# Patient Record
Sex: Male | Born: 1945 | Race: Black or African American | Hispanic: No | Marital: Married | Smoking: Never smoker
Health system: Southern US, Community
[De-identification: ages and names within clinical notes are randomized; demographics above are authoritative.]

## PROBLEM LIST (undated history)

## (undated) DIAGNOSIS — M4800 Spinal stenosis, site unspecified: Secondary | ICD-10-CM

## (undated) DIAGNOSIS — G4730 Sleep apnea, unspecified: Secondary | ICD-10-CM

## (undated) DIAGNOSIS — M5417 Radiculopathy, lumbosacral region: Secondary | ICD-10-CM

## (undated) DIAGNOSIS — E119 Type 2 diabetes mellitus without complications: Secondary | ICD-10-CM

## (undated) HISTORY — PX: COLONOSCOPY W/ POLYPECTOMY: SHX1380

## (undated) HISTORY — DX: Radiculopathy, lumbosacral region: M54.17

## (undated) HISTORY — DX: Spinal stenosis, site unspecified: M48.00

## (undated) HISTORY — DX: Type 2 diabetes mellitus without complications: E11.9

## (undated) HISTORY — PX: KNEE ARTHROSCOPY: SUR90

## (undated) HISTORY — DX: Sleep apnea, unspecified: G47.30

---

## 1999-02-12 ENCOUNTER — Ambulatory Visit (HOSPITAL_COMMUNITY): Admission: RE | Admit: 1999-02-12 | Discharge: 1999-02-12 | Payer: Self-pay | Admitting: Gastroenterology

## 1999-06-04 ENCOUNTER — Encounter: Admission: RE | Admit: 1999-06-04 | Discharge: 1999-07-19 | Payer: Self-pay | Admitting: Family Medicine

## 2001-01-16 ENCOUNTER — Emergency Department (HOSPITAL_COMMUNITY): Admission: EM | Admit: 2001-01-16 | Discharge: 2001-01-17 | Payer: Self-pay | Admitting: Emergency Medicine

## 2001-01-16 ENCOUNTER — Encounter: Payer: Self-pay | Admitting: Emergency Medicine

## 2002-05-19 ENCOUNTER — Encounter (INDEPENDENT_AMBULATORY_CARE_PROVIDER_SITE_OTHER): Payer: Self-pay

## 2002-05-19 ENCOUNTER — Ambulatory Visit (HOSPITAL_COMMUNITY): Admission: RE | Admit: 2002-05-19 | Discharge: 2002-05-19 | Payer: Self-pay | Admitting: Gastroenterology

## 2005-04-30 ENCOUNTER — Encounter: Admission: RE | Admit: 2005-04-30 | Discharge: 2005-04-30 | Payer: Self-pay | Admitting: Family Medicine

## 2006-05-21 IMAGING — CR DG LUMBAR SPINE COMPLETE 4+V
5 series · 5 of 5 positions shown · non-contrast
Comparison: None.

CLINICAL DATA: Right sciatica.

[view not recorded (1 of 5)]
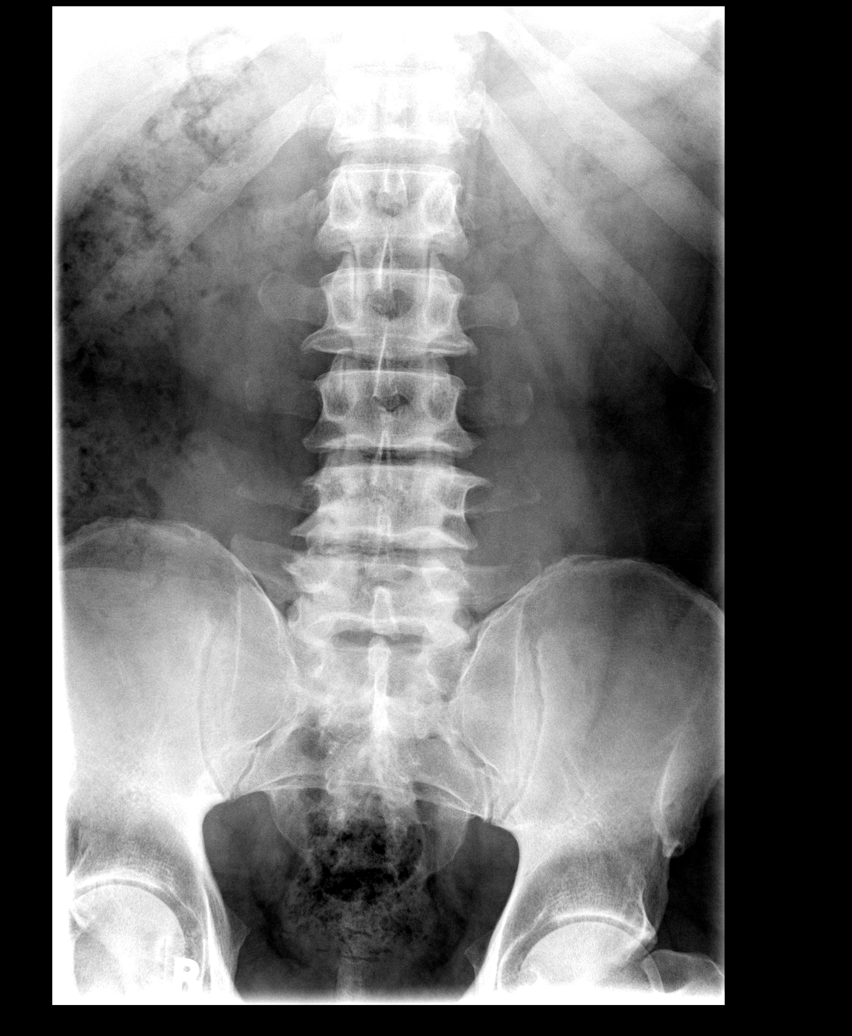

[view not recorded (2 of 5)]
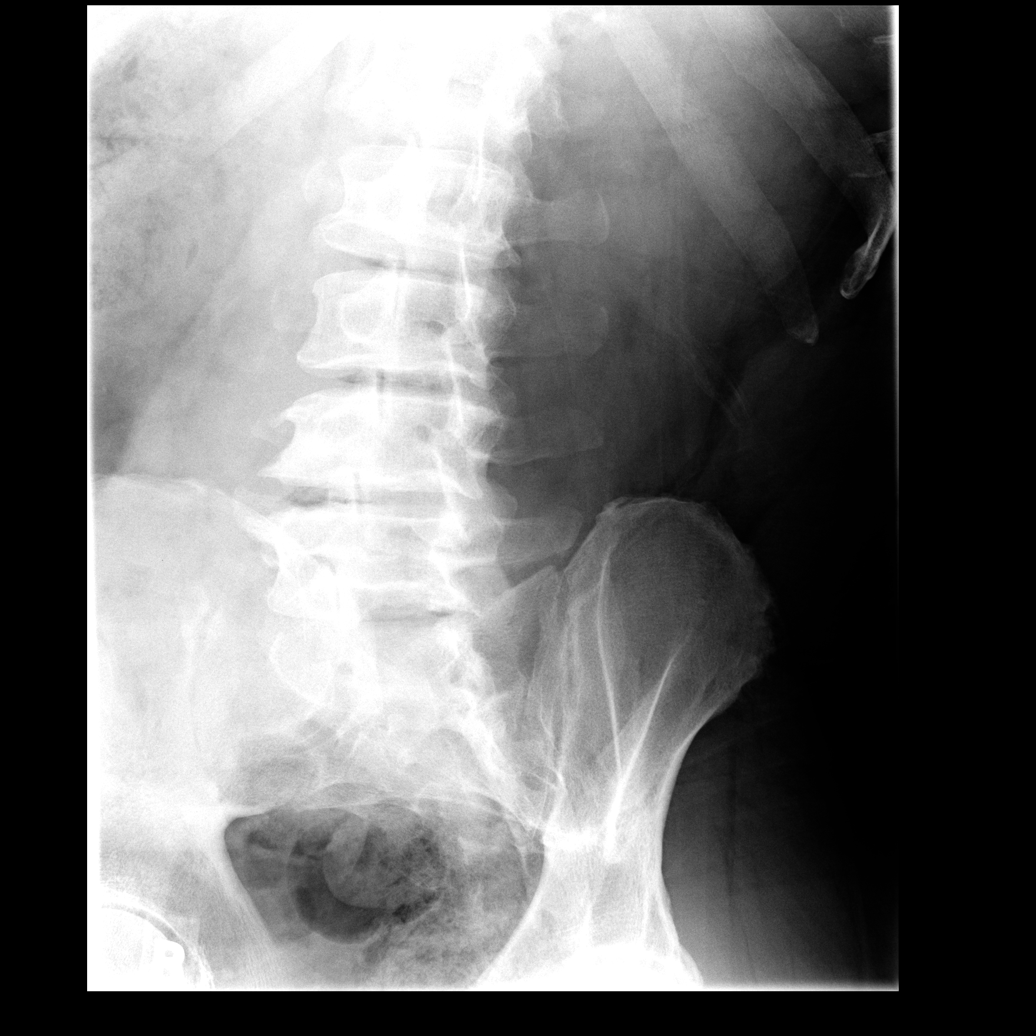

[view not recorded (3 of 5)]
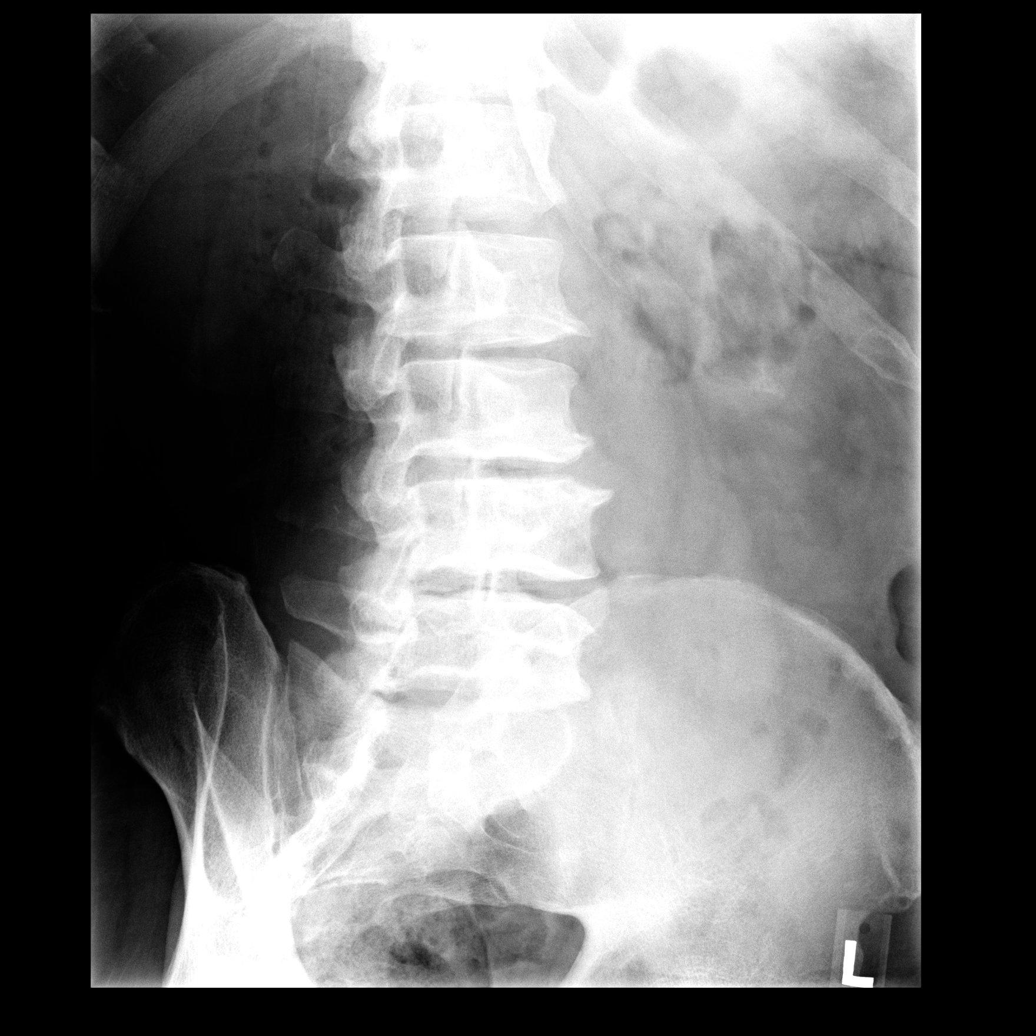

[view not recorded (4 of 5)]
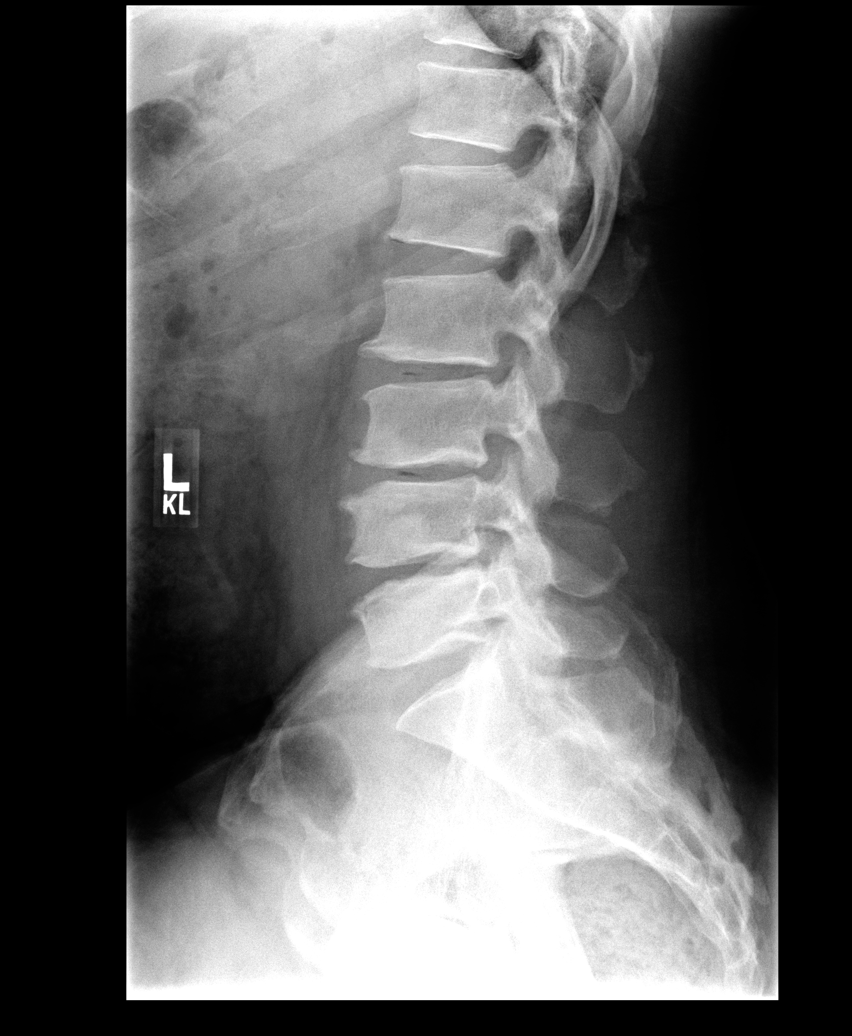

[view not recorded (5 of 5)]
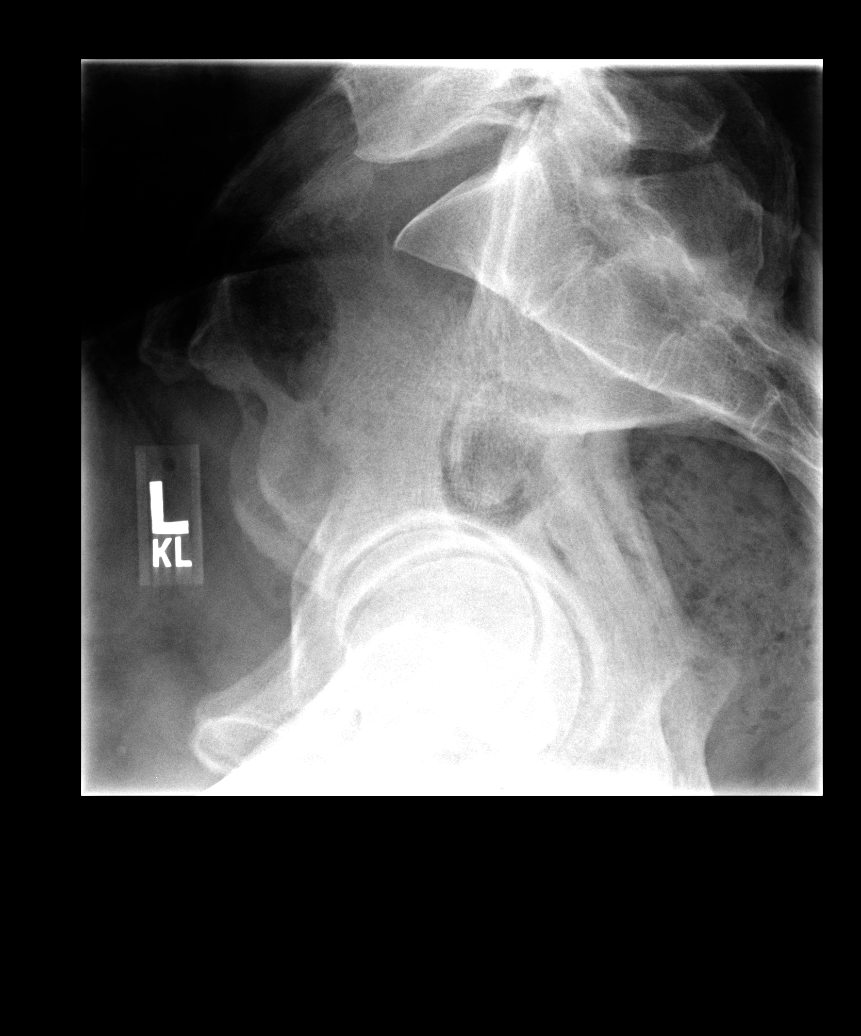

[5 of 5 positions shown; findings below may reference images not displayed]

LUMBAR SPINE - 4 VIEW:
 Five nonribbearing lumbar type vertebral bodies.  Loss of disk height noted at L2-3 through L4-5 with vacuum disk phenomenon at L2-3 and L3-4.   These same levels have associated end plate hypertrophy.  No subluxation or evidence for fracture.
IMPRESSION: Degenerative disk disease with mild facet arthropathy in the lower lumbar spine. 
 [REDACTED]

## 2006-09-26 ENCOUNTER — Encounter: Admission: RE | Admit: 2006-09-26 | Discharge: 2006-09-26 | Payer: Self-pay | Admitting: Dermatology

## 2007-07-01 ENCOUNTER — Encounter (INDEPENDENT_AMBULATORY_CARE_PROVIDER_SITE_OTHER): Payer: Self-pay | Admitting: Gastroenterology

## 2007-10-17 IMAGING — US US CAROTID DUPLEX BILAT
1 series · 14 of 24 positions shown · non-contrast
Comparison: none

CLINICAL DATA: Left eye blackouts.  
 CAROTID DOPPLER ULTRASOUND:
 Velocities are as follows (cm per second):
 SITE  PEAK SYSTOLIC  END-DIASTOLIC
 RIGHT ICA    46  21
 RIGHT CCA    68  11
 RIGHT ICA/CCA RATIO  .68
 RIGHT ECA    34

[Series 1: unknown · 0.07mm/px · 14 of 66 slices shown]
[im 1/66]
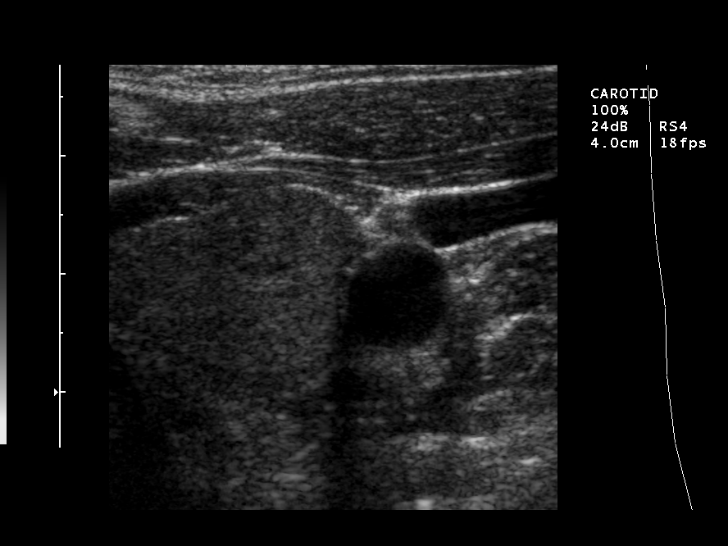
[im 6/66]
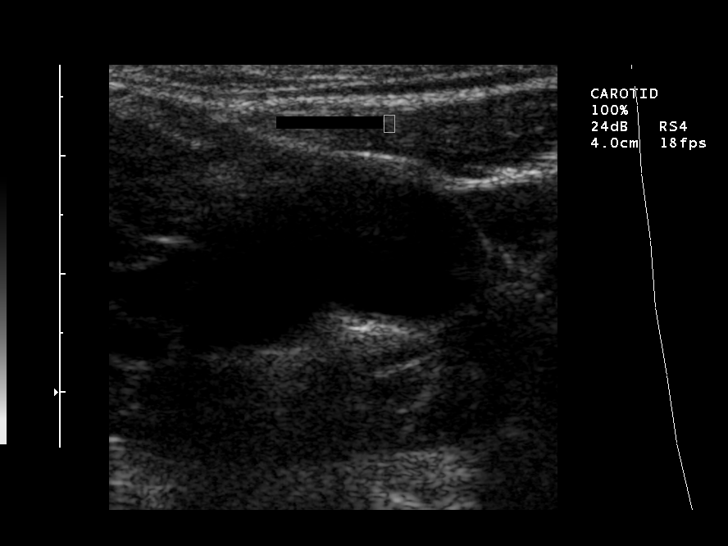
[im 12/66]
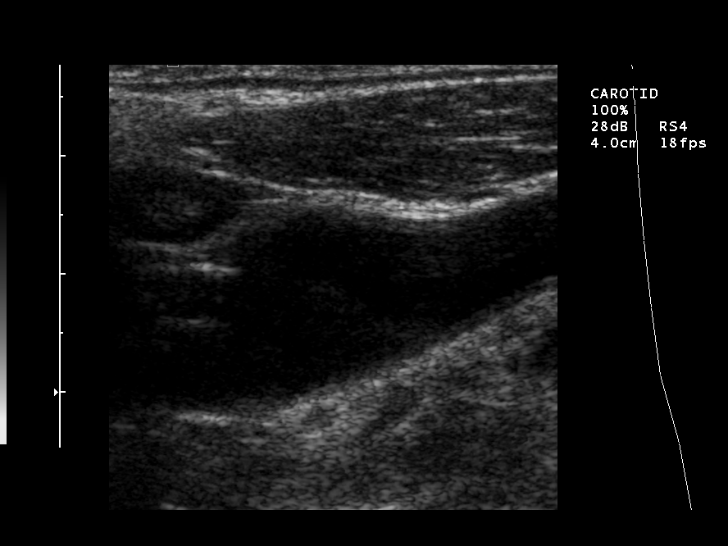
[im 17/66]
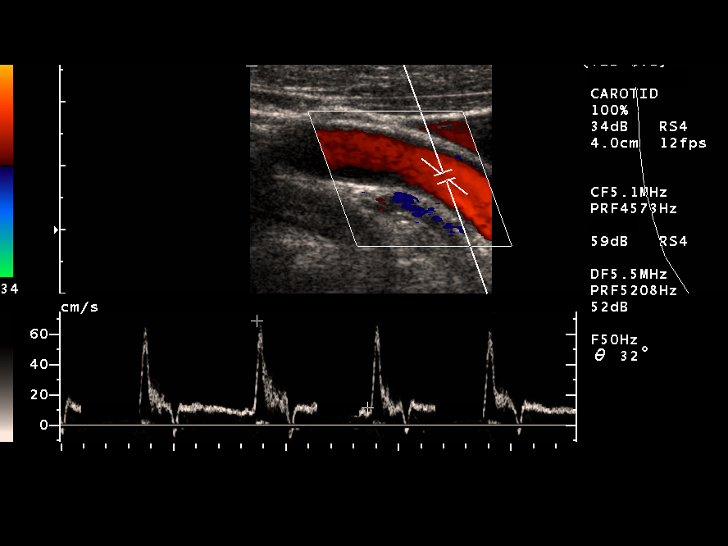
[im 20/66]
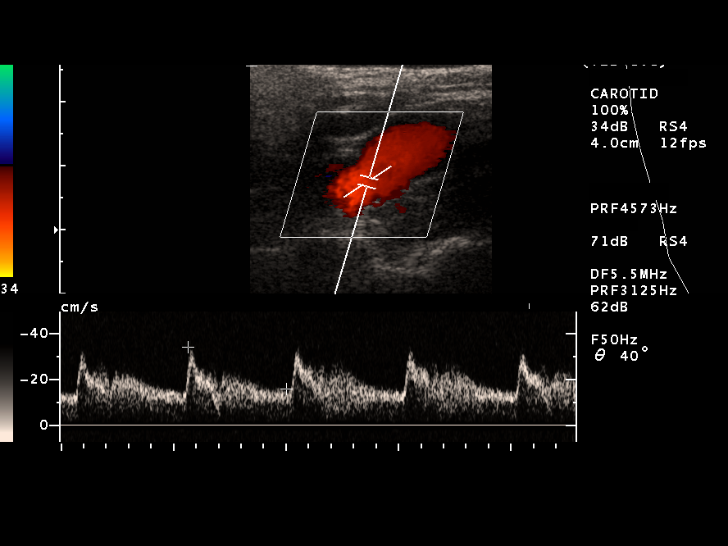
[im 26/66]
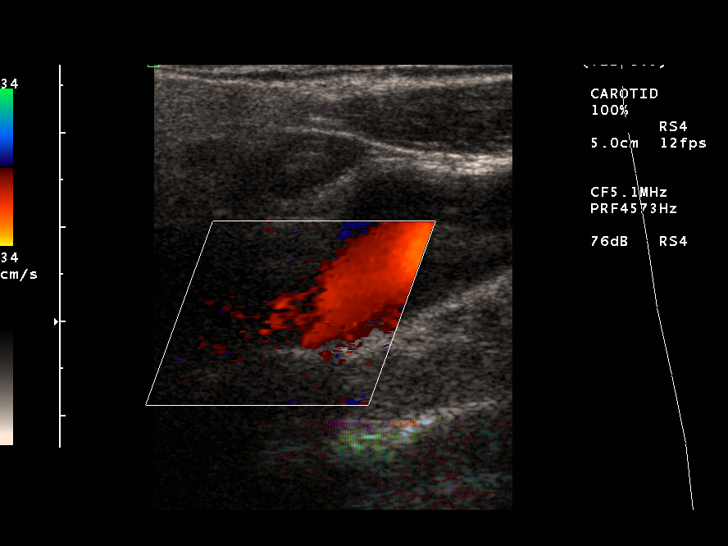
[im 32/66]
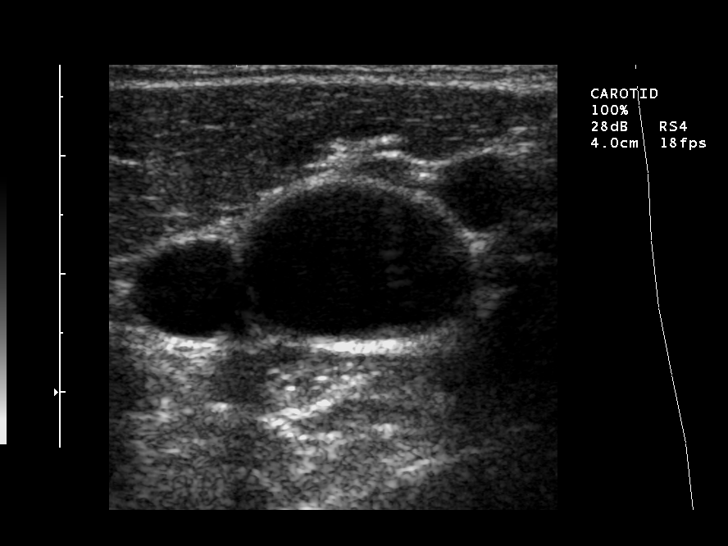
[im 34/66]
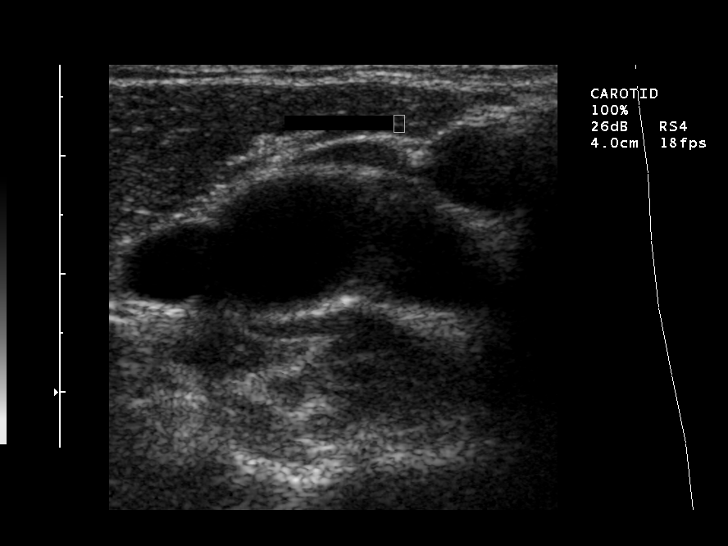
[im 40/66]
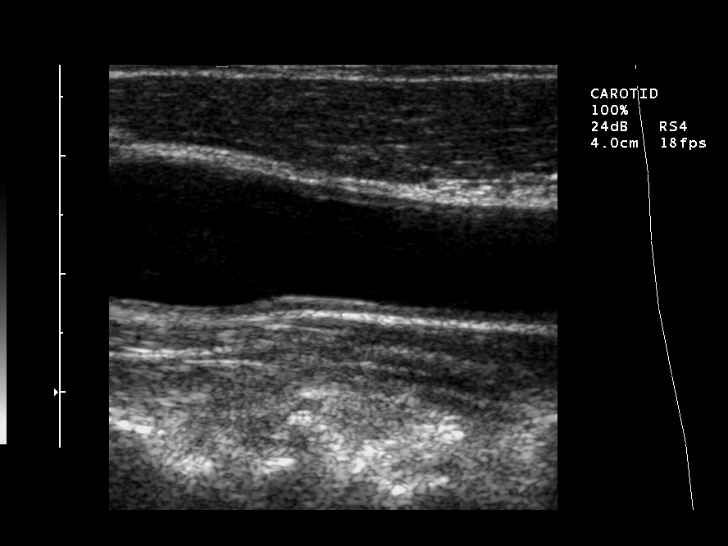
[im 46/66]
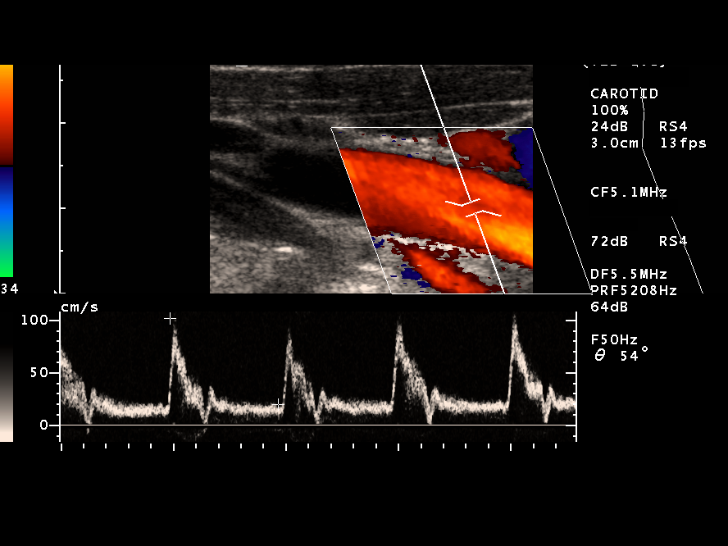
[im 51/66]
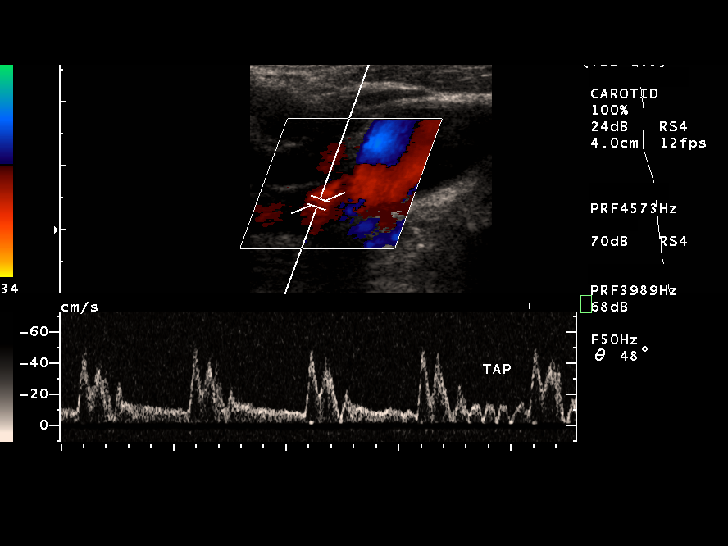
[im 54/66]
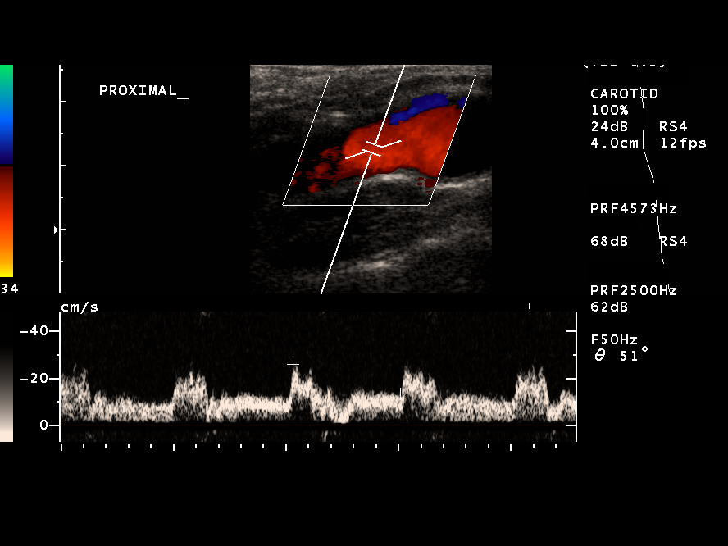
[im 60/66]
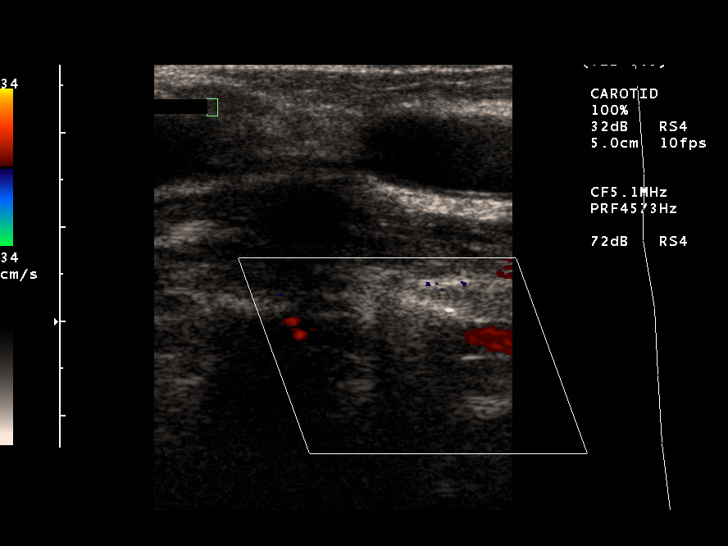
[im 66/66]
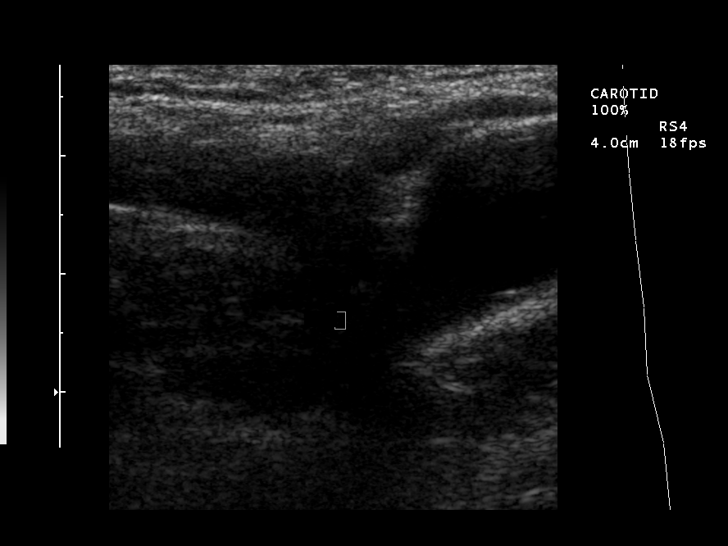

[14 of 24 positions shown; findings below may reference images not displayed]

LEFT ICA    57  27
 LEFT CCA    101  19
 LEFT ICA/CCA RATIO  .56
 LEFT ECA    50
 Minimal plaque is seen in the right ICA bulb.  The right vertebral is antegrade in flow.  Doppler analysis demonstrates a low resistance wave form in the right ICA.  There is minimal plaque in the left ICA bulb.  Doppler analysis demonstrates a low resistance wave form.  The left vertebral is antegrade in flow.
IMPRESSION: Minimal plaque in the ICA bulbs bilaterally.  Estimated stenosis is close to 0.

## 2010-01-21 ENCOUNTER — Encounter: Admission: RE | Admit: 2010-01-21 | Discharge: 2010-01-21 | Payer: Self-pay | Admitting: Internal Medicine

## 2011-02-04 ENCOUNTER — Other Ambulatory Visit: Payer: Self-pay | Admitting: Neurological Surgery

## 2011-02-04 DIAGNOSIS — M545 Low back pain: Secondary | ICD-10-CM

## 2011-02-09 ENCOUNTER — Ambulatory Visit
Admission: RE | Admit: 2011-02-09 | Discharge: 2011-02-09 | Disposition: A | Discharge status: Home / Self Care | Payer: 59 | Source: Ambulatory Visit | Attending: Neurological Surgery | Admitting: Neurological Surgery

## 2011-02-09 DIAGNOSIS — M545 Low back pain: Secondary | ICD-10-CM

## 2011-05-10 NOTE — Consult Note (Signed)
[place]  Patient:    Jermaine [name], Jermaine [name]                        MRN: [id number] Proc. Date: 01/17/01 Adm. Date:  [id number] Disc. Date: [id number] Attending:  Lorre Nick CC:         Carolyne Fiscal, M.D.   Consultation Report  HISTORY OF PRESENT ILLNESS:  Mr. Jermaine [name] is a 65 year old black gentleman without a significant past medical history, who presents with substernal chest discomfort.  The patient was in his usual state of health when at approximately 8 p.m. while driving noted the onset of midsternal chest discomfort.  This was described as a sharp pain.  It was not associated with shortness of breath, radiation, nausea, vomiting, or diaphoresis.  He had no palpitations, no lightheadedness, syncope, or presyncope.  The pain gradually started dissipating on its own, and on presentation to the emergency room was subsequently relieved with nitroglycerin.  It is unclear whether the nitroglycerin facilitated the relief of pain.  The patient denies any recent history of exertional chest discomfort, syncope, presyncope, orthopnea, paroxysmal nocturnal dyspnea, lower extremity edema, or palpitations.  PAST MEDICAL HISTORY:  Notable for sinus surgery in December 2001, and a questionable history of hypercholesterolemia which has been diet-controlled.  ALLERGIES:  No known drug allergies.  CURRENT MEDICATIONS:  Antibiotics following his sinus surgery.  SOCIAL HISTORY:  The patient has a remote history of tobacco use.  Quit greater than 30 years ago.  Occasional wine.  Denies heavy alcohol use.  The patient works as a Curator for [employer], also does janitorial work on the side.  He is an Immunologist.  FAMILY HISTORY:  Father died at age 61 with diabetes mellitus.  Mother alive at the age of 71.  No medical problems.  PHYSICAL EXAMINATION:  GENERAL:  He is a well-developed, well-nourished black male, in no acute distress.  VITAL  SIGNS:  Blood pressure 107/56, heart rate 60, temperature 97.0 degrees, respirations 18.  O2 saturation 97% on room air.  HEENT:  Pupils equal, round, reactive to light.  Extraocular muscles intact. Oropharynx:  No lesions.  NECK:  Carotid upstrokes are brisk without bruits.  HEART:  A regular rate without murmurs.  LUNGS:  Clear to auscultation.  ABDOMEN:  Soft, nontender.  Good bowel sounds, no guarding or rebound.  CHEST:  Chest wall is nontender to palpation.  EXTREMITIES:  Peripheral pulses are 3+ and equal.  There is no peripheral edema.  NEUROLOGIC:  Motor strength is 5/5.  Sensory intact to touch.  Electrocardiogram:  Normal sinus at 71.  No acute ST-T wave changes or ischemia.  Chest x-ray shows no acute infiltrates or effusions.  LABORATORY DATA:  Sodium 141, potassium 3.1, chloride 104, bicarbonate 25, BUN 8, creatinine 1.0, glucose 120.  White count 5.3, hemoglobin 14.9, hematocrit 43.1, platelets 137.  CPK 275, MB fraction 3.3, troponin I less than 0.01.  ASSESSMENT/PLAN:  [name] is a 65 year old gentleman who presents with atypical chest pain.  The patient has had a complete relief of the discomfort, and it is unclear whether nitroglycerin facilitated this.  He has had no recent symptoms at rest or with exertion, and at this point has no acute ischemic changes on the electrocardiogram to suggest an acute myocardial infarction.  I discussed treatment options with the patient, including admission to the hospital, subsequent rule out of a myocardial infarction with enzymes and electrocardiograms, and consideration of  a stress test, versus a cardiac catheterization, based on the findings, or an outpatient stress test.  As the patient is feeling better, he wishes to arrange for an outpatient stress test. I have provided him with a prescription for nitroglycerin and have instructed him to return to the emergency room for any recurrence of pain.  I will see the  patient in followup in the office. DD:  01/17/01 TD:  01/17/01 Job: [id number]

## 2011-05-10 NOTE — Procedures (Signed)
[facility]  Patient:    Jermaine, [name] Visit Number: [id number] MRN: [id number]          Service Type: END Location: ENDO Attending Physician:  Louie Bun Dictated by:   Everardo All Madilyn Fireman, M.D. Proc. Date: 05/19/02 Admit Date:  05/19/2002   CC:         Carolyne Fiscal, M.D.   Procedure Report  PROCEDURE:  Colonoscopy.  INDICATION FOR PROCEDURE:  History of adenomatous colon polyp three years ago on index colonoscopy.  DESCRIPTION OF PROCEDURE:  The patient was placed in the left lateral decubitus position then placed on the pulse monitor with continuous low flow oxygen delivered by nasal cannula. He was sedated with 60 mg IV Demerol and 6 mg IV Versed. The Olympus video colonoscope was inserted into the rectum and advanced to the cecum, confirmed by transillumination at McBurneys point and visualization of the ileocecal valve and appendiceal orifice. The prep was excellent. In the distal cecum, there was a 6 mm sessile polyp that was fulgurated by hot biopsy. The remainder of the cecum, ascending, transverse, and descending colon appeared normal with no further masses, polyps, diverticula or other mucosal abnormalities. Within the sigmoid colon, there was seen an 8 mm polyp which was removed by snare. The remainder of the sigmoid and rectum appeared normal and retroflexed view of the anus revealed no obvious internal hemorrhoids. The colonoscope was then withdrawn and the patient returned to the recovery room in stable condition. The patient tolerated the procedure well and there were no immediate complications.  IMPRESSION:  Cecal and sigmoid colon polyps.  PLAN:  Await histology and will repeat colonoscopy in 3-5 years. Dictated by:   Everardo All Madilyn Fireman, M.D. Attending Physician:  Louie Bun DD:  05/19/02 TD:  05/20/02 Job: [phone]

## 2011-11-12 ENCOUNTER — Other Ambulatory Visit: Payer: Self-pay | Admitting: Internal Medicine

## 2011-11-12 ENCOUNTER — Ambulatory Visit
Admission: RE | Admit: 2011-11-12 | Discharge: 2011-11-12 | Disposition: A | Discharge status: Home / Self Care | Payer: 59 | Source: Ambulatory Visit | Attending: Internal Medicine | Admitting: Internal Medicine

## 2011-11-12 ENCOUNTER — Other Ambulatory Visit (HOSPITAL_COMMUNITY): Payer: Self-pay | Admitting: *Deleted

## 2011-11-12 ENCOUNTER — Other Ambulatory Visit: Payer: Self-pay | Admitting: *Deleted

## 2011-11-12 DIAGNOSIS — K449 Diaphragmatic hernia without obstruction or gangrene: Secondary | ICD-10-CM

## 2011-11-12 MED ORDER — IOHEXOL 300 MG/ML  SOLN
125.0000 mL | Freq: Once | INTRAMUSCULAR | Status: AC | PRN
  Administered 2011-11-12: 125 mL via INTRAVENOUS

## 2011-12-01 ENCOUNTER — Ambulatory Visit (INDEPENDENT_AMBULATORY_CARE_PROVIDER_SITE_OTHER): Payer: 59

## 2011-12-01 DIAGNOSIS — H66009 Acute suppurative otitis media without spontaneous rupture of ear drum, unspecified ear: Secondary | ICD-10-CM

## 2012-08-06 ENCOUNTER — Other Ambulatory Visit: Payer: Self-pay | Admitting: Gastroenterology

## 2014-06-09 ENCOUNTER — Encounter: Payer: Self-pay | Admitting: *Deleted

## 2014-06-10 ENCOUNTER — Ambulatory Visit (INDEPENDENT_AMBULATORY_CARE_PROVIDER_SITE_OTHER): Payer: 59 | Admitting: Cardiology

## 2014-06-10 ENCOUNTER — Encounter: Payer: Self-pay | Admitting: Cardiology

## 2014-06-10 VITALS — BP 118/62 | HR 81 | Ht 72.0 in | Wt 228.0 lb

## 2014-06-10 DIAGNOSIS — R0609 Other forms of dyspnea: Secondary | ICD-10-CM

## 2014-06-10 DIAGNOSIS — R0600 Dyspnea, unspecified: Secondary | ICD-10-CM | POA: Insufficient documentation

## 2014-06-10 DIAGNOSIS — R0989 Other specified symptoms and signs involving the circulatory and respiratory systems: Secondary | ICD-10-CM

## 2014-06-10 NOTE — Progress Notes (Signed)
   HPI The patient presents for evaluation of dyspnea with exertion. He has no past cardiac history although he does think he had a treadmill test some years ago. He has noticed that when he does such things as going to garbage cans to the curb he has dyspnea.  This has been going on for about 6 months. He does not think that this is worse than previous. He stops what he is doing and recovers quickly. He denies any chest pressure, neck or arm discomfort. He denies any palpitations, presyncope or syncope. He has no PND or orthopnea. He has no weight gain or edema. He otherwise remains active.  No Known Allergies  Medications:  None    Past Medical History  Diagnosis Date  . Sleep apnea     CPAP  . Spinal stenosis   . 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 Knee arthroscopy      Family History  Problem Relation Age of Onset  . Diabetes Father     History   Social History  . Marital Status: Married    Spouse Name: N/A    Number of Children: 2  . Years of Education: N/A   Occupational History  . Not on file.   Social History Main Topics  . Smoking status: Never Smoker   . Smokeless tobacco: Not on file  . Alcohol Use: Not on file  . Drug Use: Not on file  . Sexual Activity: Not on file   Other Topics Concern  . Not on file   Social History Narrative   Lives with wife.      ROS:  As stated in the HPI and negative for all other systems.  PHYSICAL EXAM BP 118/62  Pulse 81  Ht 6' (1.829 m)  Wt 228 lb (103.42 kg)  BMI 30.92 kg/m2 GENERAL:  Well appearing, looks younger than stated age.  HEENT:  Pupils equal round and reactive, fundi not visualized, oral mucosa unremarkable NECK:  No jugular venous distention, waveform within normal limits, carotid upstroke brisk and symmetric, no bruits, no thyromegaly LYMPHATICS:  No cervical, inguinal adenopathy LUNGS:  Clear to auscultation bilaterally BACK:   No CVA tenderness CHEST:  Unremarkable HEART:  PMI not displaced or sustained,S1 and S2 within normal limits, no S3, no S4, no clicks, no rubs, no murmurs ABD:  Flat, positive bowel sounds normal in frequency in pitch, no bruits, no rebound, no guarding, no midline pulsatile mass, no hepatomegaly, no splenomegaly EXT:  2 plus pulses throughout, no edema, no cyanosis no clubbing SKIN:  No rashes no nodules NEURO:  Cranial nerves II through XII grossly intact, motor grossly intact throughout, left foot drop PSYCH:  Cognitively intact, oriented to person place and time   EKG:  Sinus rhythm, rate 81, left axis deviation, left bundle branch block, no acute ST-T wave changes, poor anterior R wave progression.  06/10/2014  ASSESSMENT AND PLAN  DYPSNEA:  The pretest probability of obstructive CAD is somewhat low.  I will bring the patient back for a POET (Plain Old Exercise Test). This will allow me to screen for obstructive coronary disease, risk stratify and very importantly provide a prescription for exercise.

## 2014-06-10 NOTE — Patient Instructions (Addendum)
The current medical regimen is effective;  continue present plan and medications.  Your physician has requested that you have an exercise tolerance test. For further information please visit [email or web address]. Please also follow instruction sheet, as given.  Follow up in 1 year with Dr Hochrein.  You will receive a letter in the mail 2 months before you are due.  Please call us when you receive this letter to schedule your follow up appointment.  

## 2014-06-23 ENCOUNTER — Telehealth (HOSPITAL_COMMUNITY): Payer: Self-pay

## 2014-06-23 NOTE — Telephone Encounter (Signed)
Awaiting return call

## 2014-06-28 ENCOUNTER — Ambulatory Visit (HOSPITAL_COMMUNITY)
Admission: RE | Admit: 2014-06-28 | Discharge: 2014-06-28 | Disposition: A | Discharge status: Home / Self Care | Payer: 59 | Source: Ambulatory Visit | Attending: Internal Medicine | Admitting: Internal Medicine

## 2014-06-28 DIAGNOSIS — R0609 Other forms of dyspnea: Secondary | ICD-10-CM | POA: Insufficient documentation

## 2014-06-28 DIAGNOSIS — R0989 Other specified symptoms and signs involving the circulatory and respiratory systems: Principal | ICD-10-CM | POA: Insufficient documentation

## 2014-06-28 DIAGNOSIS — R0600 Dyspnea, unspecified: Secondary | ICD-10-CM

## 2014-06-28 NOTE — Procedures (Signed)
Exercise Treadmill Test   Test  Exercise Tolerance Test Ordering MD: Marijo File, MD    Unique Test No: 1   Treadmill:  1  Indication for ETT: exertional dyspnea  Contraindication to ETT: No   Stress Modality: exercise - treadmill  Cardiac Imaging Performed: non   Protocol: standard Bruce - maximal  Max BP:  186/79  Max MPHR (bpm):  153 85% MPR (bpm):  130  MPHR obtained (bpm):  157 % MPHR obtained:  103  Reached 85% MPHR (min:sec):  4 Total Exercise Time (min-sec):  7  Workload in METS:  8.5 Borg Scale: 15  Reason ETT Terminated:  dyspnea    ST Segment Analysis At Rest: normal ST segments - no evidence of significant ST depression With Exercise: no evidence of significant ST depression  Other Information Arrhythmia:  No Angina during ETT:  absent (0) Quality of ETT:  diagnostic  ETT Interpretation:  normal - no evidence of ischemia by ST analysis  Comments: Fair exercise tolerance Normal BP response No ischemic EKG changes with exercise Duke Treadmill Score of +7 Negative bruce treadmill stress test for ischemia.  Pixie Casino, MD, [facility] Attending Cardiologist [place]

## 2014-06-30 NOTE — Telephone Encounter (Signed)
Encounter complete. 

## 2015-05-24 ENCOUNTER — Ambulatory Visit (INDEPENDENT_AMBULATORY_CARE_PROVIDER_SITE_OTHER): Payer: PPO | Admitting: Family Medicine

## 2015-05-24 ENCOUNTER — Ambulatory Visit (INDEPENDENT_AMBULATORY_CARE_PROVIDER_SITE_OTHER): Payer: PPO

## 2015-05-24 VITALS — BP 115/70 | HR 77 | Temp 98.4°F | Resp 17 | Ht 70.5 in | Wt 221.6 lb

## 2015-05-24 DIAGNOSIS — W109XXA Fall (on) (from) unspecified stairs and steps, initial encounter: Secondary | ICD-10-CM

## 2015-05-24 DIAGNOSIS — S8992XA Unspecified injury of left lower leg, initial encounter: Secondary | ICD-10-CM

## 2015-05-24 DIAGNOSIS — S76112A Strain of left quadriceps muscle, fascia and tendon, initial encounter: Secondary | ICD-10-CM | POA: Diagnosis not present

## 2015-05-24 NOTE — Progress Notes (Signed)
   Subjective:    Patient ID: Jermaine [name], male    DOB: 1946/05/21, 69 y.o.   MRN: [id number]  HPI Patient presents with dislocated knee that occurred following falling onleft knee this morning. Tripped on steps and knee hit cement sidewalk. Denies pain and endorses swelling that has improved. Has had ice on knee since injury occurrence. Denies numbness or tingling of foot and no loss of function, but had decreased ROM. Walked into office, but is unable to completely straighten leg. No previous injuries or surgeries to knee.  Denies PMH and not taking any medication. NKDA.   Review of Systems  Constitutional: Negative.   Musculoskeletal: Positive for joint swelling and gait problem. Negative for myalgias and arthralgias.  Skin: Negative for color change and wound.  Neurological: Negative for weakness and numbness.       Objective:   Physical Exam  Constitutional: He is oriented to person, place, and time. He appears well-developed and well-nourished. No distress.  Blood pressure 115/70, pulse 77, temperature 98.4 F (36.9 C), temperature source Oral, resp. rate 17, height 5' 10.5" (1.791 m), weight 221 lb 9.6 oz (100.517 kg), SpO2 98 %.  HENT:  Head: Normocephalic and atraumatic.  Right Ear: External ear normal.  Left Ear: External ear normal.  Eyes: Conjunctivae are normal. Right eye exhibits no discharge. Left eye exhibits no discharge. No scleral icterus.  Pulmonary/Chest: Effort normal.  Musculoskeletal: He exhibits edema.       Left knee: He exhibits decreased range of motion and abnormal alignment. He exhibits no swelling, no effusion, no ecchymosis, no deformity (patella dislocated), no laceration, no erythema, normal patellar mobility and no bony tenderness. Tenderness found. Patellar tendon tenderness noted. No medial joint line, no lateral joint line, no MCL and no LCL tenderness noted.       Left upper leg: He exhibits tenderness and deformity (Quad rupture). He exhibits no  bony tenderness, no swelling, no edema and no laceration.  Neurological: He is alert and oriented to person, place, and time. He displays no atrophy. No cranial nerve deficit or sensory deficit. He exhibits normal muscle tone.  Skin: Skin is warm, dry and intact. No rash noted. He is not diaphoretic.  Psychiatric: He has a normal mood and affect. His behavior is normal. Judgment and thought content normal.   [facility] reading (PRIMARY) by  Dr. Everlene Farrier. No acute fracture. Quad rupture appreciated.     Assessment & Plan:  1. Quadriceps tendon rupture, left, initial encounter 2. Knee injury, left, initial encounter Non-weightbearing on left leg. Knee immobilizer and crutches given. Ice, elevation, and rest. Tylenol or ibuprofen for pain. Scheduled with Dr. Lillia Corporal at [facility] 05/26/15 @ 3:30. - DG Knee Complete 4 Views Left; Future - AMB referral to orthopedics   Alveta Heimlich PA-C  [facility] 05/24/2015 3:01 PM

## 2015-05-24 NOTE — Patient Instructions (Addendum)
No weight on left leg. Ice, elevation, and rest.   Scheduled with Dr. Lillia Corporal at [facility] 05/26/15 @ 3:30. Must arrive by 3:00.

## 2015-05-29 ENCOUNTER — Encounter (HOSPITAL_COMMUNITY): Payer: Self-pay | Admitting: *Deleted

## 2015-05-30 ENCOUNTER — Encounter (HOSPITAL_COMMUNITY): Payer: Self-pay | Admitting: Anesthesiology

## 2015-05-30 ENCOUNTER — Observation Stay (HOSPITAL_COMMUNITY)
Admission: RE | Admit: 2015-05-30 | Discharge: 2015-05-31 | Disposition: A | Discharge status: Home / Self Care | Payer: PPO | Source: Ambulatory Visit | Attending: Orthopedic Surgery | Admitting: Orthopedic Surgery

## 2015-05-30 ENCOUNTER — Encounter (HOSPITAL_COMMUNITY)
Admission: RE | Disposition: A | Discharge status: Home / Self Care | Payer: Self-pay | Source: Ambulatory Visit | Attending: Orthopedic Surgery

## 2015-05-30 ENCOUNTER — Ambulatory Visit (HOSPITAL_COMMUNITY): Payer: PPO | Admitting: Anesthesiology

## 2015-05-30 DIAGNOSIS — M21372 Foot drop, left foot: Secondary | ICD-10-CM | POA: Insufficient documentation

## 2015-05-30 DIAGNOSIS — M5417 Radiculopathy, lumbosacral region: Secondary | ICD-10-CM | POA: Insufficient documentation

## 2015-05-30 DIAGNOSIS — Z6831 Body mass index (BMI) 31.0-31.9, adult: Secondary | ICD-10-CM | POA: Diagnosis not present

## 2015-05-30 DIAGNOSIS — S76112A Strain of left quadriceps muscle, fascia and tendon, initial encounter: Principal | ICD-10-CM | POA: Diagnosis present

## 2015-05-30 DIAGNOSIS — G4730 Sleep apnea, unspecified: Secondary | ICD-10-CM | POA: Insufficient documentation

## 2015-05-30 DIAGNOSIS — Y929 Unspecified place or not applicable: Secondary | ICD-10-CM | POA: Insufficient documentation

## 2015-05-30 DIAGNOSIS — W1839XA Other fall on same level, initial encounter: Secondary | ICD-10-CM | POA: Insufficient documentation

## 2015-05-30 DIAGNOSIS — S76119A Strain of unspecified quadriceps muscle, fascia and tendon, initial encounter: Secondary | ICD-10-CM | POA: Diagnosis present

## 2015-05-30 HISTORY — PX: QUADRICEPS TENDON REPAIR: SHX756

## 2015-05-30 LAB — CBC
HEMATOCRIT: 42.1 % (ref 39.0–52.0)
HEMOGLOBIN: 15.3 g/dL (ref 13.0–17.0)
MCH: 32.7 pg (ref 26.0–34.0)
MCHC: 36.3 g/dL — ABNORMAL HIGH (ref 30.0–36.0)
MCV: 90 fL (ref 78.0–100.0)
Platelets: 134 10*3/uL — ABNORMAL LOW (ref 150–400)
RBC: 4.68 MIL/uL (ref 4.22–5.81)
RDW: 12.3 % (ref 11.5–15.5)
WBC: 4.5 10*3/uL (ref 4.0–10.5)

## 2015-05-30 LAB — GLUCOSE, CAPILLARY: GLUCOSE-CAPILLARY: 146 mg/dL — AB (ref 65–99)

## 2015-05-30 SURGERY — REPAIR, TENDON, QUADRICEPS
Anesthesia: General | Laterality: Left

## 2015-05-30 MED ORDER — CEFAZOLIN SODIUM-DEXTROSE 2-3 GM-% IV SOLR
2.0000 g | Freq: Once | INTRAVENOUS | Status: AC
  Administered 2015-05-30: 2 g via INTRAVENOUS

## 2015-05-30 MED ORDER — ACETAMINOPHEN 325 MG PO TABS: 650.0000 mg | ORAL_TABLET | Freq: Four times a day (QID) | ORAL | Status: DC | PRN

## 2015-05-30 MED ORDER — MIDAZOLAM HCL 2 MG/2ML IJ SOLN: 0.5000 mg | Freq: Once | INTRAMUSCULAR | Status: DC | PRN

## 2015-05-30 MED ORDER — MIDAZOLAM HCL 2 MG/2ML IJ SOLN
INTRAMUSCULAR | Status: AC
  Filled 2015-05-30: qty 2

## 2015-05-30 MED ORDER — HYDROMORPHONE HCL 1 MG/ML IJ SOLN
INTRAMUSCULAR | Status: AC
  Filled 2015-05-30: qty 1

## 2015-05-30 MED ORDER — ONDANSETRON HCL 4 MG/2ML IJ SOLN
INTRAMUSCULAR | Status: DC | PRN
  Administered 2015-05-30: 4 mg via INTRAVENOUS

## 2015-05-30 MED ORDER — HYDROMORPHONE HCL 1 MG/ML IJ SOLN
0.2500 mg | INTRAMUSCULAR | Status: DC | PRN
  Administered 2015-05-30 (×4): 0.5 mg via INTRAVENOUS

## 2015-05-30 MED ORDER — HYDROMORPHONE HCL 1 MG/ML IJ SOLN: 0.5000 mg | INTRAMUSCULAR | Status: DC | PRN

## 2015-05-30 MED ORDER — LIDOCAINE HCL (CARDIAC) 20 MG/ML IV SOLN
INTRAVENOUS | Status: AC
  Filled 2015-05-30: qty 5

## 2015-05-30 MED ORDER — 0.9 % SODIUM CHLORIDE (POUR BTL) OPTIME
TOPICAL | Status: DC | PRN
  Administered 2015-05-30 (×2): 1000 mL

## 2015-05-30 MED ORDER — ONDANSETRON HCL 4 MG PO TABS: 4.0000 mg | ORAL_TABLET | Freq: Four times a day (QID) | ORAL | Status: DC | PRN

## 2015-05-30 MED ORDER — DEXAMETHASONE SODIUM PHOSPHATE 4 MG/ML IJ SOLN
INTRAMUSCULAR | Status: AC
  Filled 2015-05-30: qty 1

## 2015-05-30 MED ORDER — ONDANSETRON HCL 4 MG/2ML IJ SOLN
INTRAMUSCULAR | Status: AC
  Filled 2015-05-30: qty 2

## 2015-05-30 MED ORDER — CEFAZOLIN SODIUM-DEXTROSE 2-3 GM-% IV SOLR
2.0000 g | Freq: Four times a day (QID) | INTRAVENOUS | Status: AC
  Administered 2015-05-30 – 2015-05-31 (×3): 2 g via INTRAVENOUS
  Filled 2015-05-30 (×3): qty 50

## 2015-05-30 MED ORDER — DEXAMETHASONE SODIUM PHOSPHATE 4 MG/ML IJ SOLN
INTRAMUSCULAR | Status: DC | PRN
  Administered 2015-05-30: 4 mg via INTRAVENOUS

## 2015-05-30 MED ORDER — KETOROLAC TROMETHAMINE 15 MG/ML IJ SOLN
INTRAMUSCULAR | Status: AC
  Filled 2015-05-30: qty 1

## 2015-05-30 MED ORDER — DOCUSATE SODIUM 100 MG PO CAPS
100.0000 mg | ORAL_CAPSULE | Freq: Two times a day (BID) | ORAL | Status: DC
Start: 2015-05-30 — End: 2015-05-31
  Administered 2015-05-30 – 2015-05-31 (×2): 100 mg via ORAL
  Filled 2015-05-30 (×2): qty 1

## 2015-05-30 MED ORDER — FENTANYL CITRATE (PF) 250 MCG/5ML IJ SOLN
INTRAMUSCULAR | Status: AC
  Filled 2015-05-30: qty 5

## 2015-05-30 MED ORDER — LACTATED RINGERS IV SOLN
INTRAVENOUS | Status: DC | PRN
  Administered 2015-05-30 (×2): via INTRAVENOUS

## 2015-05-30 MED ORDER — METOCLOPRAMIDE HCL 5 MG/ML IJ SOLN
5.0000 mg | Freq: Three times a day (TID) | INTRAMUSCULAR | Status: DC | PRN
Start: 2015-05-30 — End: 2015-05-31

## 2015-05-30 MED ORDER — HYDROCODONE-ACETAMINOPHEN 5-325 MG PO TABS
1.0000 | ORAL_TABLET | ORAL | Status: DC | PRN
  Administered 2015-05-30: 1 via ORAL
  Administered 2015-05-31: 2 via ORAL
  Filled 2015-05-30: qty 1
  Filled 2015-05-30 (×2): qty 2

## 2015-05-30 MED ORDER — MIDAZOLAM HCL 5 MG/5ML IJ SOLN
INTRAMUSCULAR | Status: DC | PRN
  Administered 2015-05-30: 2 mg via INTRAVENOUS

## 2015-05-30 MED ORDER — SODIUM CHLORIDE 0.9 % IV SOLN
INTRAVENOUS | Status: DC
  Administered 2015-05-30: 100 mL/h via INTRAVENOUS

## 2015-05-30 MED ORDER — PROMETHAZINE HCL 25 MG/ML IJ SOLN: 6.2500 mg | INTRAMUSCULAR | Status: DC | PRN

## 2015-05-30 MED ORDER — KETOROLAC TROMETHAMINE 15 MG/ML IJ SOLN
7.5000 mg | Freq: Four times a day (QID) | INTRAMUSCULAR | Status: AC
  Administered 2015-05-30 – 2015-05-31 (×3): 7.5 mg via INTRAVENOUS
  Filled 2015-05-30 (×3): qty 1

## 2015-05-30 MED ORDER — PROPOFOL 10 MG/ML IV BOLUS
INTRAVENOUS | Status: DC | PRN
  Administered 2015-05-30: 200 mg via INTRAVENOUS

## 2015-05-30 MED ORDER — LIDOCAINE HCL (CARDIAC) 20 MG/ML IV SOLN
INTRAVENOUS | Status: DC | PRN
  Administered 2015-05-30: 20 mg via INTRAVENOUS

## 2015-05-30 MED ORDER — SENNA 8.6 MG PO TABS
2.0000 | ORAL_TABLET | Freq: Every day | ORAL | Status: DC
  Administered 2015-05-30: 17.2 mg via ORAL
  Filled 2015-05-30: qty 2

## 2015-05-30 MED ORDER — ACETAMINOPHEN 650 MG RE SUPP: 650.0000 mg | Freq: Four times a day (QID) | RECTAL | Status: DC | PRN

## 2015-05-30 MED ORDER — METOCLOPRAMIDE HCL 5 MG PO TABS: 5.0000 mg | ORAL_TABLET | Freq: Three times a day (TID) | ORAL | Status: DC | PRN

## 2015-05-30 MED ORDER — FENTANYL CITRATE (PF) 100 MCG/2ML IJ SOLN
INTRAMUSCULAR | Status: DC | PRN
  Administered 2015-05-30 (×7): 50 ug via INTRAVENOUS

## 2015-05-30 MED ORDER — MEPERIDINE HCL 25 MG/ML IJ SOLN: 6.2500 mg | INTRAMUSCULAR | Status: DC | PRN

## 2015-05-30 MED ORDER — ASPIRIN EC 325 MG PO TBEC
325.0000 mg | DELAYED_RELEASE_TABLET | Freq: Two times a day (BID) | ORAL | Status: DC
Start: 2015-05-31 — End: 2015-05-31
  Administered 2015-05-31 (×2): 325 mg via ORAL
  Filled 2015-05-30 (×2): qty 1

## 2015-05-30 MED ORDER — LACTATED RINGERS IV SOLN
INTRAVENOUS | Status: DC
  Administered 2015-05-30: 09:00:00 via INTRAVENOUS

## 2015-05-30 MED ORDER — ONDANSETRON HCL 4 MG/2ML IJ SOLN: 4.0000 mg | Freq: Four times a day (QID) | INTRAMUSCULAR | Status: DC | PRN

## 2015-05-30 SURGICAL SUPPLY — 58 items
ALCOHOL 70% 16 OZ (MISCELLANEOUS) IMPLANT
BANDAGE ELASTIC 4 VELCRO ST LF (GAUZE/BANDAGES/DRESSINGS) ×3 IMPLANT
BANDAGE ELASTIC 6 VELCRO ST LF (GAUZE/BANDAGES/DRESSINGS) ×3 IMPLANT
CHLORAPREP W/TINT 26ML (MISCELLANEOUS) ×3 IMPLANT
CLOSURE STERI-STRIP 1/2X4 (GAUZE/BANDAGES/DRESSINGS) ×1
CLSR STERI-STRIP ANTIMIC 1/2X4 (GAUZE/BANDAGES/DRESSINGS) ×2 IMPLANT
CUFF TOURNIQUET SINGLE 34IN LL (TOURNIQUET CUFF) ×3 IMPLANT
DERMABOND ADHESIVE PROPEN (GAUZE/BANDAGES/DRESSINGS) ×2
DERMABOND ADVANCED (GAUZE/BANDAGES/DRESSINGS) ×2
DERMABOND ADVANCED .7 DNX12 (GAUZE/BANDAGES/DRESSINGS) ×1 IMPLANT
DERMABOND ADVANCED .7 DNX6 (GAUZE/BANDAGES/DRESSINGS) ×1 IMPLANT
DRAPE U-SHAPE 47X51 STRL (DRAPES) ×3 IMPLANT
DRSG AQUACEL AG ADV 3.5X10 (GAUZE/BANDAGES/DRESSINGS) ×3 IMPLANT
DRSG PAD ABDOMINAL 8X10 ST (GAUZE/BANDAGES/DRESSINGS) ×3 IMPLANT
ELECT REM PT RETURN 9FT ADLT (ELECTROSURGICAL) ×3
ELECTRODE REM PT RTRN 9FT ADLT (ELECTROSURGICAL) ×1 IMPLANT
GLOVE BIO SURGEON STRL SZ7 (GLOVE) ×3 IMPLANT
GLOVE BIOGEL PI IND STRL 6.5 (GLOVE) ×1 IMPLANT
GLOVE BIOGEL PI IND STRL 7.0 (GLOVE) ×1 IMPLANT
GLOVE BIOGEL PI IND STRL 8.5 (GLOVE) ×1 IMPLANT
GLOVE BIOGEL PI INDICATOR 6.5 (GLOVE) ×2
GLOVE BIOGEL PI INDICATOR 7.0 (GLOVE) ×2
GLOVE BIOGEL PI INDICATOR 8.5 (GLOVE) ×2
GLOVE ECLIPSE 6.5 STRL STRAW (GLOVE) ×3 IMPLANT
GLOVE SURG ORTHO 8.5 STRL (GLOVE) ×9 IMPLANT
GLOVE SURG SS PI 6.0 STRL IVOR (GLOVE) ×3 IMPLANT
GOWN STRL REUS W/ TWL LRG LVL3 (GOWN DISPOSABLE) ×2 IMPLANT
GOWN STRL REUS W/ TWL XL LVL3 (GOWN DISPOSABLE) IMPLANT
GOWN STRL REUS W/TWL 2XL LVL3 (GOWN DISPOSABLE) ×6 IMPLANT
GOWN STRL REUS W/TWL LRG LVL3 (GOWN DISPOSABLE) ×4
GOWN STRL REUS W/TWL XL LVL3 (GOWN DISPOSABLE)
IMMOBILIZER KNEE 22 UNIV (SOFTGOODS) ×3 IMPLANT
KIT BASIN OR (CUSTOM PROCEDURE TRAY) ×3 IMPLANT
KIT ROOM TURNOVER OR (KITS) ×3 IMPLANT
NS IRRIG 1000ML POUR BTL (IV SOLUTION) ×3 IMPLANT
PACK ORTHO EXTREMITY (CUSTOM PROCEDURE TRAY) ×3 IMPLANT
PAD ARMBOARD 7.5X6 YLW CONV (MISCELLANEOUS) ×9 IMPLANT
PADDING CAST ABS 6INX4YD NS (CAST SUPPLIES) ×4
PADDING CAST ABS COTTON 6X4 NS (CAST SUPPLIES) ×2 IMPLANT
RETRIEVER SUT HEWSON (MISCELLANEOUS) ×3 IMPLANT
SPONGE LAP 18X18 X RAY DECT (DISPOSABLE) ×12 IMPLANT
SPONGE LAP 4X18 X RAY DECT (DISPOSABLE) IMPLANT
STAPLER VISISTAT 35W (STAPLE) ×3 IMPLANT
STOCKINETTE IMPERVIOUS 9X36 MD (GAUZE/BANDAGES/DRESSINGS) ×3 IMPLANT
SUT FIBERWIRE #2 38 T-5 BLUE (SUTURE) ×9
SUT MON AB 2-0 CT1 36 (SUTURE) ×6 IMPLANT
SUT VIC AB 0 CT1 27 (SUTURE) ×12
SUT VIC AB 0 CT1 27XBRD ANBCTR (SUTURE) ×6 IMPLANT
SUT VIC AB 2-0 CT1 27 (SUTURE) ×6
SUT VIC AB 2-0 CT1 27XBRD (SUTURE) ×2 IMPLANT
SUT VIC AB 2-0 CT1 TAPERPNT 27 (SUTURE) ×1 IMPLANT
SUTURE FIBERWR #2 38 T-5 BLUE (SUTURE) ×3 IMPLANT
TOWEL OR 17X24 6PK STRL BLUE (TOWEL DISPOSABLE) ×3 IMPLANT
TOWEL OR 17X26 10 PK STRL BLUE (TOWEL DISPOSABLE) ×3 IMPLANT
TUBE CONNECTING 12'X1/4 (SUCTIONS) ×1
TUBE CONNECTING 12X1/4 (SUCTIONS) ×2 IMPLANT
WATER STERILE IRR 1000ML POUR (IV SOLUTION) IMPLANT
YANKAUER SUCT BULB TIP NO VENT (SUCTIONS) ×3 IMPLANT

## 2015-05-30 NOTE — Anesthesia Postprocedure Evaluation (Signed)
  Anesthesia Post-op Note  Patient: Jermaine [name]  Procedure(s) Performed: Procedure(s): REPAIR LEFT QUADRICEP TENDON (Left)  Patient Location: PACU  Anesthesia Type:General  Level of Consciousness: awake, alert , oriented and patient cooperative  Airway and Oxygen Therapy: Patient Spontanous Breathing and Patient connected to nasal cannula oxygen  Post-op Pain: mild  Post-op Assessment: Post-op Vital signs reviewed, Patient's Cardiovascular Status Stable, Respiratory Function Stable, Patent Airway, No signs of Nausea or vomiting and Pain level controlled  Post-op Vital Signs: Reviewed and stable  Last Vitals:  Filed Vitals:   05/30/15 1415  BP:   Pulse: 77  Temp:   Resp: 10    Complications: No apparent anesthesia complications

## 2015-05-30 NOTE — Op Note (Signed)
[place]  SABAN, HEINLEN NO.:  [id number]  MEDICAL RECORD NO.:  [id number]  LOCATION:  5N24C                        FACILITY:  [facility]  PHYSICIAN:  Rod Can, MD     DATE OF BIRTH:  [birth year]  DATE OF PROCEDURE:  05/30/2015 DATE OF DISCHARGE:                              OPERATIVE REPORT   SURGEON:  Rod Can, MD  ASSISTANT:  None.  PREOPERATIVE DIAGNOSIS:  Left quadriceps tendon rupture.  POSTOPERATIVE DIAGNOSIS:  Left quadriceps tendon rupture.  PROCEDURE PERFORMED:  Repair of left quadriceps tendon rupture.  ANESTHESIA:  General.  EBL:  150 mL.  TOURNIQUET TIME:  Not utilized.  ANTIBIOTICS:  2 g of Ancef.  COMPLICATIONS:  None.  SPECIMENS:  None.  DISPOSITION:  Stable to PACU.  INDICATIONS:  The patient is a 69 year old male who tripped on a concrete slab and landed on a flexed left knee.  He had inability to weight bear.  He was unable to extend his left knee.  MRI showed a quadriceps  Tendon rupture. Risks, benefits and alternatives to quadriceps tendon  reconstruction were explained and he elected to proceed.  DESCRIPTION OF PROCEDURE IN DETAIL:  The patient was correctly identified in the preop holding area using two identifiers.  I marked the surgical site.  He was taken to the operating room and placed supine on the operating room table.  General anesthesia was induced.  A nonsterile tourniquet was applied to the left thigh, but we did not utilize it.  The left lower extremity was prepped and draped in normal sterile surgical fashion.  Time-out was called verifying the site and site of surgery.  He did receive IV antibiotics within 60 minutes at the beginning of the procedure.  I made approximately 10-cm longitudinal incision, centered over the proximal pole of the patella.  I made full- thickness skin flaps.  I carried the dissection down to the extensor mechanism.  He was found to have a tear of the quadriceps tendon off  the bony surface of the superior pole of the patella.  The tendon edge was frayed.  He had large medial and lateral retinacular tears.  I began by irrigating the hematoma and copiously irrigating the knee with sterile saline.  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  I began by using curved Mayo scissors as well as a #10 blade to debride the degenerated quadriceps tendon tissue.  I then used a rongeur to debride the proximal patella to bleeding bone.  Using a 2-mm drill bit, I drilled three longitudinal holes in the patella.  I then used a #2 FiberWire suture x2 to place a running Krackow stitch in the quadriceps Tendon, giving four strands.  The four strands were passed through the three drill holes in the patella and the sutures were provisionally tightened.  I examined patellar tracking, which was good.  The sutures were then tightened and then I tied the two limbs together.  The suture was clipped.  The repair was very solid.  I was able to flex him down to about 50-60 degrees without any gapping.  The  medial and lateral retinacular repairs were then repaired with 0 interrupted Vicryl sutures.  I oversewed the repair with 0 interrupted Vicryl sutures as well.  I then copiously irrigated the extensor mechanism and subcutaneous tissues with sterile saline.  The wound was then closed using 2-0 interrupted Monocryl for the deep dermal layer followed by staples for the skin.  I applied Dermabond and then once the glue dried, I placed Aquacel dressing.  A compressive dressing was applied.  Then, the patient was extubated, transferred to the PACU in stable condition. Sponge, needle, and instrument counts were correct at the end of the case x2.  There were no known complications.  I discussed the operative events and findings with the patient's family. We will admit him for overnight observation.  We will start  him on aspirin for DVT prophylaxis.  He understands that he must keep his knee in full extension at all times in a knee immobilizer.  We will let him weightbear as tolerated with crutches.  I will plan to see him back in the office in 2 weeks.  All questions were solicited and answered to his satisfaction.          ______________________________ Rod Can, MD     BS/MEDQ  D:  05/30/2015  T:  05/30/2015  Job:  [id number]

## 2015-05-30 NOTE — Anesthesia Preprocedure Evaluation (Addendum)
Anesthesia Evaluation  Patient identified by MRN, date of birth, ID band Patient awake    Reviewed: Allergy & Precautions, NPO status , Patient's Chart, lab work & pertinent test results  History of Anesthesia Complications Negative for: history of anesthetic complications  Airway Mallampati: II  TM Distance: >3 FB Neck ROM: Full    Dental  (+) Dental Advisory Given   Pulmonary sleep apnea and Continuous Positive Airway Pressure Ventilation ,  breath sounds clear to auscultation        Cardiovascular - anginanegative cardio ROS  Rhythm:Regular Rate:Normal     Neuro/Psych Lumbar stenosis negative neurological ROS     GI/Hepatic negative GI ROS, Neg liver ROS,   Endo/Other  Morbid obesity  Renal/GU negative Renal ROS     Musculoskeletal   Abdominal (+) + obese,   Peds  Hematology negative hematology ROS (+)   Anesthesia Other Findings   Reproductive/Obstetrics                            Anesthesia Physical Anesthesia Plan  ASA: II  Anesthesia Plan: General   Post-op Pain Management:    Induction: Intravenous  Airway Management Planned: LMA  Additional Equipment:   Intra-op Plan:   Post-operative Plan:   Informed Consent: I have reviewed the patients History and Physical, chart, labs and discussed the procedure including the risks, benefits and alternatives for the proposed anesthesia with the patient or authorized representative who has indicated his/her understanding and acceptance.   Dental advisory given  Plan Discussed with: CRNA and Surgeon  Anesthesia Plan Comments: (Plan routine monitors, GA- LMA OK Surgeon refuses femoral nerve block for analgesia)        Anesthesia Quick Evaluation

## 2015-05-30 NOTE — H&P (Signed)
PREOPERATIVE H&P  Chief Complaint: LEFT QUAD TENDON RUPTURE  HPI: Jermaine [name] is a 69 y.o. male who presents for preoperative history and physical with a diagnosis of LEFT QUAD TENDON RUPTURE.  He is indicated for surgical management due to instability and inability to extend the left knee.   Past Medical History  Diagnosis Date  . Sleep apnea     CPAP  . Spinal stenosis   . Lumbosacral radiculopathy at L5     foot drop left   Past Surgical History  Procedure Laterality Date  . Knee arthroscopy Left   . Colonoscopy w/ polypectomy     History   Social History  . Marital Status: Married    Spouse Name: N/A  . Number of Children: 2  . Years of Education: N/A   Social History Main Topics  . Smoking status: Never Smoker   . Smokeless tobacco: Not on file  . Alcohol Use: No  . Drug Use: No  . Sexual Activity: Not on file   Other Topics Concern  . None   Social History Narrative   Lives with wife.     Family History  Problem Relation Age of Onset  . Diabetes Father    No Known Allergies Prior to Admission medications   Not on File     Positive ROS: All other systems have been reviewed and were otherwise negative with the exception of those mentioned in the HPI and as above.  Physical Exam: General: Alert, no acute distress Cardiovascular: No pedal edema Respiratory: No cyanosis, no use of accessory musculature GI: No organomegaly, abdomen is soft and non-tender Skin: No lesions in the area of chief complaint Neurologic: Sensation intact distally Psychiatric: Patient is competent for consent with normal mood and affect Lymphatic: No axillary or cervical lymphadenopathy  MUSCULOSKELETAL: L knee skin intact. Palpable defect at superior pole of patella. Unable to SLR. 2+ DP pulse.  Assessment: LEFT QUAD TENDON RUPTURE  Plan: Plan for Procedure(s): REPAIR LEFT QUADRICEP TENDON  The risks benefits and alternatives were discussed with the patient including  but not limited to the risks of nonoperative treatment, versus surgical intervention including infection, bleeding, nerve injury,  blood clots, cardiopulmonary complications, morbidity, mortality, among others, and they were willing to proceed.   Novia Lansberry, Horald Pollen, MD Cell [phone]   05/30/2015 9:52 AM

## 2015-05-30 NOTE — Progress Notes (Signed)
Orthopedic Tech Progress Note Patient Details:  Jermaine [name] [birth year] [id number]  Patient ID: Rod Can, male   DOB: 1946-03-22, 69 y.o.   MRN: [id number] called in bio-tech brace order; spoke with Riley Nearing, Robertine Kipper 05/30/2015, 10:50 AM

## 2015-05-30 NOTE — Transfer of Care (Signed)
Immediate Anesthesia Transfer of Care Note  Patient: Jermaine [name]  Procedure(s) Performed: Procedure(s): REPAIR LEFT QUADRICEP TENDON (Left)  Patient Location: PACU  Anesthesia Type:General  Level of Consciousness: awake, oriented and patient cooperative  Airway & Oxygen Therapy: Patient Spontanous Breathing and Patient connected to face mask oxygen  Post-op Assessment: Report given to RN and Post -op Vital signs reviewed and stable  Post vital signs: Reviewed  Last Vitals:  Filed Vitals:   05/30/15 0823  BP: 114/62  Pulse: 66  Temp: 36.9 C  Resp: 18    Complications: No apparent anesthesia complications

## 2015-05-30 NOTE — Interval H&P Note (Signed)
History and Physical Interval Note:  05/30/2015 9:54 AM  Jermaine [name]  has presented today for surgery, with the diagnosis of LEFT QUAD TENDON RUPTURE  The various methods of treatment have been discussed with the patient and family. After consideration of risks, benefits and other options for treatment, the patient has consented to  Procedure(s): REPAIR LEFT QUADRICEP TENDON (Left) as a surgical intervention .  The patient's history has been reviewed, patient examined, no change in status, stable for surgery.  I have reviewed the patient's chart and labs.  Questions were answered to the patient's satisfaction.     Teresa Lemmerman, Horald Pollen

## 2015-05-30 NOTE — Anesthesia Procedure Notes (Signed)
Procedure Name: LMA Insertion Date/Time: 05/30/2015 10:43 AM Performed by: Luciana Axe K Pre-anesthesia Checklist: Patient identified, Emergency Drugs available, Suction available, Patient being monitored and Timeout performed Patient Re-evaluated:Patient Re-evaluated prior to inductionOxygen Delivery Method: Circle system utilized Preoxygenation: Pre-oxygenation with 100% oxygen Intubation Type: IV induction Ventilation: Mask ventilation without difficulty LMA: LMA inserted LMA Size: 4.0 Number of attempts: 1 Placement Confirmation: positive ETCO2,  CO2 detector and breath sounds checked- equal and bilateral Tube secured with: Tape Dental Injury: Teeth and Oropharynx as per pre-operative assessment

## 2015-05-30 NOTE — Progress Notes (Signed)
Orthopedic Tech Progress Note Patient Details:  Jermaine [name] 21-Jan-1946 [id number]  Patient ID: Jermaine [name], male   DOB: 10-20-1946, 69 y.o.   MRN: [id number] RN stated that Dr. Lyla Glassing has cancelled the brace order for bio-tech.   Hildred Priest 05/30/2015, 11:31 AM

## 2015-05-30 NOTE — Discharge Instructions (Signed)
Wear knee immobilizer at all times. Do NOT bend your knee. Do not remove your surgical dressing. You may wear as much weight as comfortable with crutches / walker. Take Aspirin 325mg  twice daily x2 weeks for blood clot prevention.

## 2015-05-30 NOTE — Brief Op Note (Signed)
05/30/2015  12:56 PM  PATIENT:  Jermaine [name]  69 y.o. male  PRE-OPERATIVE DIAGNOSIS:  LEFT QUAD TENDON RUPTURE  POST-OPERATIVE DIAGNOSIS:  LEFT QUAD TENDON RUPTURE  PROCEDURE:  Procedure(s): REPAIR LEFT QUADRICEP TENDON (Left)  SURGEON:  Surgeon(s) and Role:    * Jermaine Can, MD - Primary  PHYSICIAN ASSISTANT: none  ASSISTANTS: none   ANESTHESIA:   general  EBL:  Total I/O In: 1750 [I.V.:1750] Out: 150 [Blood:150]  BLOOD ADMINISTERED:none  DRAINS: none   LOCAL MEDICATIONS USED:  NONE  SPECIMEN:  No Specimen  DISPOSITION OF SPECIMEN:  N/A  COUNTS:  YES  TOURNIQUET:  * No tourniquets in log *  DICTATION: .Other Dictation: Dictation Number [id number]  PLAN OF CARE: Admit for overnight observation  PATIENT DISPOSITION:  PACU - hemodynamically stable.   Delay start of Pharmacological VTE agent (>24hrs) due to surgical blood loss or risk of bleeding: not applicable

## 2015-05-31 ENCOUNTER — Encounter (HOSPITAL_COMMUNITY): Payer: Self-pay | Admitting: Orthopedic Surgery

## 2015-05-31 DIAGNOSIS — S76112A Strain of left quadriceps muscle, fascia and tendon, initial encounter: Secondary | ICD-10-CM | POA: Diagnosis not present

## 2015-05-31 MED ORDER — SENNA 8.6 MG PO TABS: 2.0000 | ORAL_TABLET | Freq: Every day | ORAL | Status: DC

## 2015-05-31 MED ORDER — DOCUSATE SODIUM 100 MG PO CAPS: 100.0000 mg | ORAL_CAPSULE | Freq: Two times a day (BID) | ORAL | Status: DC

## 2015-05-31 MED ORDER — ASPIRIN 325 MG PO TBEC: 325.0000 mg | DELAYED_RELEASE_TABLET | Freq: Two times a day (BID) | ORAL | Status: DC

## 2015-05-31 MED ORDER — HYDROCODONE-ACETAMINOPHEN 5-325 MG PO TABS: 1.0000 | ORAL_TABLET | ORAL | Status: DC | PRN

## 2015-05-31 NOTE — Progress Notes (Signed)
   Subjective:  Patient reports pain as mild to moderate.  Denies N/V/CP/SOB.  Objective:   VITALS:   Filed Vitals:   05/30/15 2300 05/30/15 2335 05/31/15 0100 05/31/15 0536  BP: 116/68  117/64 126/75  Pulse: 74  72 69  Temp: 98.3 F (36.8 C)  98.4 F (36.9 C) 97.7 F (36.5 C)  TempSrc: Oral   Oral  Resp: 19 16 19 19   Height:      Weight:      SpO2: 100%  100% 100%    Sensation intact distally Intact pulses distally Incision: dressing C/D/I Weak TA/EHL due to foot drop - baseline   Lab Results  Component Value Date   WBC 4.5 05/30/2015   HGB 15.3 05/30/2015   HCT 42.1 05/30/2015   MCV 90.0 05/30/2015   PLT 134* 05/30/2015   BMET No results found for: NA, K, CL, CO2, GLUCOSE, BUN, CREATININE, CALCIUM, GFRNONAA, GFRAA   Assessment/Plan: 1 Day Post-Op   Principal Problem:   Traumatic rupture of left quadriceps tendon Active Problems:   Quadriceps tendon rupture    WBAT in knee immobilizer Knee immobilizer at all times DVT ppx: ASA, SCDs PO pain control PT D/c home today   Elie Goody 05/31/2015, 7:07 AM   Rod Can, MD Cell [phone]

## 2015-05-31 NOTE — Discharge Summary (Signed)
Physician Discharge Summary  Patient ID: Jermaine [name] MRN: [id number] DOB/AGE: 21-Feb-1946 69 y.o.  Admit date: 05/30/2015 Discharge date: 05/31/2015  Admission Diagnoses:  Traumatic rupture of left quadriceps tendon  Discharge Diagnoses:  Principal Problem:   Traumatic rupture of left quadriceps tendon Active Problems:   Quadriceps tendon rupture   Past Medical History  Diagnosis Date  . Sleep apnea     CPAP  . Spinal stenosis   . Lumbosacral radiculopathy at L5     foot drop left    Surgeries: Procedure(s): REPAIR LEFT QUADRICEP TENDON on 05/30/2015   Consultants (if any):    Discharged Condition: Improved  Hospital Course: Jermaine [name] is an 69 y.o. male who was admitted 05/30/2015 with a diagnosis of Traumatic rupture of left quadriceps tendon and went to the operating room on 05/30/2015 and underwent the above named procedures.    He was given perioperative antibiotics:  Anti-infectives    Start     Dose/Rate Route Frequency Ordered Stop   05/30/15 1730  ceFAZolin (ANCEF) IVPB 2 g/50 mL premix     2 g 100 mL/hr over 30 Minutes Intravenous Every 6 hours 05/30/15 1655 05/31/15 0556   05/30/15 1115  ceFAZolin (ANCEF) IVPB 2 g/50 mL premix     2 g 100 mL/hr over 30 Minutes Intravenous  Once 05/30/15 1104 05/30/15 1050    .  He was given sequential compression devices, early ambulation, and ASA for DVT prophylaxis.  He benefited maximally from the hospital stay and there were no complications.    Recent vital signs:  Filed Vitals:   05/31/15 0536  BP: 126/75  Pulse: 69  Temp: 97.7 F (36.5 C)  Resp: 19    Recent laboratory studies:  Lab Results  Component Value Date   HGB 15.3 05/30/2015   Lab Results  Component Value Date   WBC 4.5 05/30/2015   PLT 134* 05/30/2015   No results found for: INR No results found for: NA, K, CL, CO2, BUN, CREATININE, GLUCOSE  Discharge Medications:     Medication List    TAKE these medications        aspirin 325 MG  EC tablet  Take 1 tablet (325 mg total) by mouth 2 (two) times daily after a meal.     docusate sodium 100 MG capsule  Commonly known as:  COLACE  Take 1 capsule (100 mg total) by mouth 2 (two) times daily.     HYDROcodone-acetaminophen 5-325 MG per tablet  Commonly known as:  NORCO/VICODIN  Take 1-2 tablets by mouth every 4 (four) hours as needed (breakthrough pain).     senna 8.6 MG Tabs tablet  Commonly known as:  SENOKOT  Take 2 tablets (17.2 mg total) by mouth at bedtime.        Diagnostic Studies: Dg Knee Complete 4 Views Left  05/24/2015   CLINICAL DATA:  Left knee injury and pain.  Initial encounter.  EXAM: LEFT KNEE - COMPLETE 4+ VIEW  COMPARISON:  None.  FINDINGS: There is no evidence of fracture, dislocation, or joint effusion. There is no evidence of arthropathy or other focal bone abnormality. Prepatellar soft tissue swelling is noted.  IMPRESSION: Prepatellar soft tissue swelling. No evidence of fracture or knee joint effusion.   Electronically Signed   By: Earle Gell M.D.   On: 05/24/2015 14:14    Disposition: Final discharge disposition not confirmed      Discharge Instructions    Call MD / Call 911  Complete by:  As directed   If you experience chest pain or shortness of breath, CALL 911 and be transported to the hospital emergency room.  If you develope a fever above 101 F, pus (white drainage) or increased drainage or redness at the wound, or calf pain, call your surgeon's office.     Constipation Prevention    Complete by:  As directed   Drink plenty of fluids.  Prune juice may be helpful.  You may use a stool softener, such as Colace (over the counter) 100 mg twice a day.  Use MiraLax (over the counter) for constipation as needed.     Diet - low sodium heart healthy    Complete by:  As directed      Discharge instructions    Complete by:  As directed   Wear knee immobilizer at all times. Do NOT bend your knee.     Driving restrictions    Complete by:  As  directed   No driving     Lifting restrictions    Complete by:  As directed   No lifting           Follow-up Information    Follow up with Parminder Cupples, Horald Pollen, MD. Schedule an appointment as soon as possible for a visit in 2 weeks.   Specialty:  Orthopedic Surgery   Why:  For wound re-check   Contact information:   [address] [place] [address] [phone]        Signed: Elie Goody 05/31/2015, 7:16 AM

## 2015-05-31 NOTE — Evaluation (Signed)
Physical Therapy Evaluation Patient Details Name: Jermaine [name] MRN: [id number] DOB: January 11, 1946 Today's Date: 05/31/2015   History of Present Illness  Patient is a 69 y/o male s/p left quad tendon repair. PMH includes spinal stenosis and sleep apnea.   Clinical Impression  Patient presents with pain and post surgical deficits LLE s/p left quad tendon repair, KI and AROM restrictions. Pt compliant with KI and maintaining knee extension during mobility. Tolerated stair training and gait training with min guard-supervision. Pt has 24/7 S at home from spouse. All education completed. Pt does not require skilled therapy services and safe to discharge from a mobility standpoint. Discharge from therapy.    Follow Up Recommendations No PT follow up;Supervision - Intermittent    Equipment Recommendations  None recommended by PT    Recommendations for Other Services       Precautions / Restrictions Precautions Precaution Comments: No knee flexion. Required Braces or Orthoses: Knee Immobilizer - Left Knee Immobilizer - Left: On at all times Restrictions Weight Bearing Restrictions: Yes LLE Weight Bearing: Weight bearing as tolerated      Mobility  Bed Mobility Overal bed mobility: Needs Assistance Bed Mobility: Supine to Sit     Supine to sit: Min guard;HOB elevated     General bed mobility comments: Min guard for safety. INstructed pt to use RLE to assist bringing LLE to EOB. Increased time due to pain.   Transfers Overall transfer level: Needs assistance Equipment used: Crutches Transfers: Sit to/from Stand Sit to Stand: Supervision         General transfer comment: Supervision for safety. Instructed pt to stand with Bil crutches under 1 UE and to transfer other crutch to other UE when steady in standing. Stood from [employer], from chair x2.   Ambulation/Gait Ambulation/Gait assistance: Supervision Ambulation Distance (Feet): 150 Feet Assistive device: Crutches Gait  Pattern/deviations: Step-through pattern;Decreased stance time - left;Decreased step length - right;Decreased weight shift to left;Trunk flexed   Gait velocity interpretation: Below normal speed for age/gender General Gait Details: Slow, steady gait. 4 point gait pattern noted. Able to place weight through LLE. Cues for gait training/safety.   Stairs Stairs: Yes Stairs assistance: Supervision Stair Management: Step to pattern;Backwards;With crutches Number of Stairs: 2 General stair comments: Cues for safety and technique. Used crutch for 1 UE and rail for other UE.   Wheelchair Mobility    Modified Rankin (Stroke Patients Only)       Balance Overall balance assessment: Needs assistance Sitting-balance support: Feet supported;No upper extremity supported Sitting balance-Leahy Scale: Good     Standing balance support: During functional activity Standing balance-Leahy Scale: Fair                               Pertinent Vitals/Pain Pain Assessment: 0-10 Pain Score: 8  Pain Location: LLE Pain Descriptors / Indicators: Sore;Aching Pain Intervention(s): Monitored during session;Repositioned;Premedicated before session    Home Living Family/patient expects to be discharged to:: Private residence Living Arrangements: Spouse/significant other Available Help at Discharge: Family;Available 24 hours/day Type of Home: House Home Access: Stairs to enter Entrance Stairs-Rails: Right Entrance Stairs-Number of Steps: 2 Home Layout: One level Home Equipment: Crutches      Prior Function Level of Independence: Independent         Comments: Pt using crutches 1 week prior to surgery     Hand Dominance        Extremity/Trunk Assessment   Upper Extremity  Assessment: Defer to OT evaluation           Lower Extremity Assessment: LLE deficits/detail   LLE Deficits / Details: Able to mobilize ankle/toe.  Decreased strength DF. Ki donned.      Communication    Communication: No difficulties  Cognition Arousal/Alertness: Awake/alert Behavior During Therapy: WFL for tasks assessed/performed Overall Cognitive Status: Within Functional Limits for tasks assessed                      General Comments General comments (skin integrity, edema, etc.): Adjusted crutch height.    Exercises General Exercises - Lower Extremity Ankle Circles/Pumps: Both;15 reps;Supine Quad Sets: Left;5 reps;Supine      Assessment/Plan    PT Assessment Patent does not need any further PT services  PT Diagnosis Acute pain;Difficulty walking   PT Problem List    PT Treatment Interventions     PT Goals (Current goals can be found in the Care Plan section) Acute Rehab PT Goals PT Goal Formulation: All assessment and education complete, DC therapy    Frequency     Barriers to discharge        Co-evaluation               End of Session Equipment Utilized During Treatment: Gait belt;Left knee immobilizer Activity Tolerance: Patient tolerated treatment well Patient left: in chair;with call bell/phone within reach Nurse Communication: Mobility status    Functional Assessment Tool Used: Clinical judgment Functional Limitation: Mobility: Walking and moving around Mobility: Walking and Moving Around Current Status (L2751): At least 1 percent but less than 20 percent impaired, limited or restricted Mobility: Walking and Moving Around Goal Status [phone]): At least 1 percent but less than 20 percent impaired, limited or restricted    Time: 0910-0945 PT Time Calculation (min) (ACUTE ONLY): 35 min   Charges:   PT Evaluation $Initial PT Evaluation Tier I: 1 Procedure PT Treatments $Gait Training: 8-22 mins   PT G Codes:   PT G-Codes **NOT FOR INPATIENT CLASS** Functional Assessment Tool Used: Clinical judgment Functional Limitation: Mobility: Walking and moving around Mobility: Walking and Moving Around Current Status (C9449): At least 1 percent  but less than 20 percent impaired, limited or restricted Mobility: Walking and Moving Around Goal Status [phone]): At least 1 percent but less than 20 percent impaired, limited or restricted    [place] 05/31/2015, 10:32 AM Wray Kearns, PT, DPT [phone]

## 2015-05-31 NOTE — Progress Notes (Signed)
Discharge instructions and Rx reviewed with patient and his wife. Administered Vicodin 2 tabs PO administered @ 1843 just b/f discharge to manage pain on the drive home. Wife left to retrieve the car with his personal belongings to meet the Nursing Tech and her husband front [place]. Patient pushed in w/c per nursing tech to [address] [place] enterence for drive home.

## 2015-06-13 ENCOUNTER — Telehealth: Payer: Self-pay | Admitting: *Deleted

## 2015-06-13 NOTE — Telephone Encounter (Signed)
called for fm hx/status, unable to reach pt... 

## 2015-06-15 ENCOUNTER — Ambulatory Visit: Payer: Self-pay | Admitting: Cardiology

## 2015-09-14 ENCOUNTER — Encounter: Payer: Self-pay | Admitting: Cardiology

## 2015-09-14 ENCOUNTER — Ambulatory Visit (INDEPENDENT_AMBULATORY_CARE_PROVIDER_SITE_OTHER): Payer: PPO | Admitting: Cardiology

## 2015-09-14 VITALS — BP 116/82 | HR 64 | Ht 72.0 in | Wt 215.2 lb

## 2015-09-14 DIAGNOSIS — R0600 Dyspnea, unspecified: Secondary | ICD-10-CM | POA: Diagnosis not present

## 2015-09-14 NOTE — Progress Notes (Signed)
   HPI The patient presents for follow up of dyspnea with exertion.   At the last visit he had some shortness of breath.  However, he had a negative POET (Plain Old Exercise Treadmill).  Since I last saw him he has done well.  The patient denies any new symptoms such as chest discomfort, neck or arm discomfort. There has been no new shortness of breath, PND or orthopnea. There have been no reported palpitations, presyncope or syncope.  He is recovering from leg injury and surgery.  He is doing well with PT.    No Known Allergies  Medications:  None    Past Medical History  Diagnosis Date  . Sleep apnea     CPAP  . Spinal stenosis   . 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 Knee arthroscopy Left   . Colonoscopy w/ polypectomy    . Quadriceps tendon repair Left 05/30/2015    Procedure: REPAIR LEFT QUADRICEP TENDON;  Surgeon: Rod Can, MD;  Location: [facility];  Service: Orthopedics;  Laterality: Left;    ROS:  As stated in the HPI and negative for all other systems.  PHYSICAL EXAM BP 116/82 mmHg  Pulse 64  Ht 6' (1.829 m)  Wt 215 lb 3.2 oz (97.614 kg)  BMI 29.18 kg/m2 GENERAL:  Well appearing, looks younger than stated age.  NECK:  No jugular venous distention, waveform within normal limits, carotid upstroke brisk and symmetric, no bruits, no thyromegaly LUNGS:  Clear to auscultation bilaterally CHEST:  Unremarkable HEART:  PMI not displaced or sustained,S1 and S2 within normal limits, no S3, no S4, no clicks, no rubs, no murmurs ABD:  Flat, positive bowel sounds normal in frequency in pitch, no bruits, no rebound, no guarding, no midline pulsatile mass, no hepatomegaly, no splenomegaly EXT:  2 plus pulses throughout, no edema, no cyanosis no clubbing   EKG:  Sinus rhythm, rate 64, left axis deviation, LAFB, no acute ST-T wave changes, poor anterior R wave progression.  09/14/2015  ASSESSMENT AND  PLAN  DYPSNEA:  This is improved.  No further work up is indicated.  He will continue with exercise and risk reduction.  Of note I applauded his weight loss.

## 2015-09-14 NOTE — Patient Instructions (Signed)
Your physician recommends that you schedule a follow-up appointment in: As Needed    

## 2016-04-25 DIAGNOSIS — E1165 Type 2 diabetes mellitus with hyperglycemia: Secondary | ICD-10-CM | POA: Diagnosis not present

## 2016-04-25 DIAGNOSIS — K4090 Unilateral inguinal hernia, without obstruction or gangrene, not specified as recurrent: Secondary | ICD-10-CM | POA: Diagnosis not present

## 2016-05-01 DIAGNOSIS — G4733 Obstructive sleep apnea (adult) (pediatric): Secondary | ICD-10-CM | POA: Diagnosis not present

## 2016-05-07 ENCOUNTER — Ambulatory Visit: Payer: Self-pay | Admitting: Surgery

## 2016-05-07 DIAGNOSIS — Z01818 Encounter for other preprocedural examination: Secondary | ICD-10-CM | POA: Diagnosis not present

## 2016-05-07 DIAGNOSIS — K4020 Bilateral inguinal hernia, without obstruction or gangrene, not specified as recurrent: Secondary | ICD-10-CM | POA: Diagnosis not present

## 2016-05-07 NOTE — H&P (Signed)
Jermaine [name] 05/07/2016 10:34 AM Location: [facility] Patient #: [id number] DOB: [birth year] Married / Language: English / Race: Black or African American Male  History of Present Illness Adin Hector MD; 05/07/2016 11:03 AM) The patient is a 70 year old male who presents with an inguinal hernia. Note for "Inguinal hernia": Patient sent for surgical consultation at the request of Dr. Jori Moll polite. Concern for right inguinal hernia.  Pleasant active male. Has noticed a lump in his right groin for the past couple years. His optically pain for bothersome. He thinks it might be gotten slightly larger but no major changes. The point worried he has to push it back in. Feels gurgling with it. Was concerned. Discuss with his primary care physician. Concern for radical hernia. Recommendation made to consider surgical evaluation for possible repair.  Patient does not smoke. He is rather active. Walks regularly. Has bowel movement every day. He's never had any abdominal surgery. Not really on any medications. Denied any significant difficulty with urination or defecation. He thought he had a bulge at his belly button years ago but it went away. Does not feel it now. No discomfort on the left groin or bulging there that he is aware of.   Past Surgical History Marjean Donna, CMA; 05/07/2016 10:35 AM) Knee Surgery Left. Oral Surgery  Allergies Davy Pique Bynum, CMA; 05/07/2016 10:36 AM) Aspirin *ANALGESICS - NonNarcotic*  Medication History (Sonya Bynum, CMA; 05/07/2016 10:36 AM) No Current Medications Medications Reconciled  Social History Marjean Donna, CMA; 05/07/2016 10:35 AM) Alcohol use Moderate alcohol use. No caffeine use No drug use  Family History Marjean Donna, CMA; 05/07/2016 10:35 AM) Diabetes Mellitus Father.     Review of Systems Davy Pique Bynum CMA; 05/07/2016 10:35 AM) General Not Present- Appetite Loss, Chills, Fatigue, Fever, Night Sweats, Weight  Gain and Weight Loss. Skin Not Present- Change in Wart/Mole, Dryness, Hives, Jaundice, New Lesions, Non-Healing Wounds, Rash and Ulcer. HEENT Not Present- Earache, Hearing Loss, Hoarseness, Nose Bleed, Oral Ulcers, Ringing in the Ears, Seasonal Allergies, Sinus Pain, Sore Throat, Visual Disturbances, Wears glasses/contact lenses and Yellow Eyes. Respiratory Not Present- Bloody sputum, Chronic Cough, Difficulty Breathing, Snoring and Wheezing. Breast Not Present- Breast Mass, Breast Pain, Nipple Discharge and Skin Changes. Cardiovascular Not Present- Chest Pain, Difficulty Breathing Lying Down, Leg Cramps, Palpitations, Rapid Heart Rate, Shortness of Breath and Swelling of Extremities. Gastrointestinal Not Present- Abdominal Pain, Bloating, Bloody Stool, Change in Bowel Habits, Chronic diarrhea, Constipation, Difficulty Swallowing, Excessive gas, Gets full quickly at meals, Hemorrhoids, Indigestion, Nausea, Rectal Pain and Vomiting. Male Genitourinary Not Present- Blood in Urine, Change in Urinary Stream, Frequency, Impotence, Nocturia, Painful Urination, Urgency and Urine Leakage. Musculoskeletal Not Present- Back Pain, Joint Pain, Joint Stiffness, Muscle Pain, Muscle Weakness and Swelling of Extremities. Neurological Not Present- Decreased Memory, Fainting, Headaches, Numbness, Seizures, Tingling, Tremor, Trouble walking and Weakness. Psychiatric Not Present- Anxiety, Bipolar, Change in Sleep Pattern, Depression, Fearful and Frequent crying. Endocrine Present- New Diabetes. Not Present- Cold Intolerance, Excessive Hunger, Hair Changes, Heat Intolerance and Hot flashes.  Vitals (Sonya Bynum CMA; 05/07/2016 10:35 AM) 05/07/2016 10:35 AM Weight: 209 lb Height: 70in Body Surface Area: 2.13 m Body Mass Index: 29.99 kg/m  Temp.: 31F(Temporal)  Pulse: 78 (Regular)  BP: 126/76 (Sitting, Left Arm, Standard)      Physical Exam Adin Hector MD; 05/07/2016 10:58 AM)  General Mental  Status-Alert. General Appearance-Not in acute distress, Not Sickly. Orientation-Oriented X3. Hydration-Well hydrated. Voice-Normal.  Integumentary Global Assessment Upon inspection and palpation of  skin surfaces of the - Axillae: non-tender, no inflammation or ulceration, no drainage. and Distribution of scalp and body hair is normal. General Characteristics Temperature - normal warmth is noted.  Head and Neck Head-normocephalic, atraumatic with no lesions or palpable masses. Face Global Assessment - atraumatic, no absence of expression. Neck Global Assessment - no abnormal movements, no bruit auscultated on the right, no bruit auscultated on the left, no decreased range of motion, non-tender. Trachea-midline. Thyroid Gland Characteristics - non-tender.  Eye Eyeball - Left-Extraocular movements intact, No Nystagmus. Eyeball - Right-Extraocular movements intact, No Nystagmus. Cornea - Left-No Hazy. Cornea - Right-No Hazy. Sclera/Conjunctiva - Left-No scleral icterus, No Discharge. Sclera/Conjunctiva - Right-No scleral icterus, No Discharge. Pupil - Left-Direct reaction to light normal. Pupil - Right-Direct reaction to light normal.  ENMT Ears Pinna - Left - no drainage observed, no generalized tenderness observed. Right - no drainage observed, no generalized tenderness observed. Nose and Sinuses External Inspection of the Nose - no destructive lesion observed. Inspection of the nares - Left - quiet respiration. Right - quiet respiration. Mouth and Throat Lips - Upper Lip - no fissures observed, no pallor noted. Lower Lip - no fissures observed, no pallor noted. Nasopharynx - no discharge present. Oral Cavity/Oropharynx - Tongue - no dryness observed. Oral Mucosa - no cyanosis observed. Hypopharynx - no evidence of airway distress observed.  Chest and Lung Exam Inspection Movements - Normal and Symmetrical. Accessory muscles - No use of accessory  muscles in breathing. Palpation Palpation of the chest reveals - Non-tender. Auscultation Breath sounds - Normal and Clear.  Cardiovascular Auscultation Rhythm - Regular. Murmurs & Other Heart Sounds - Auscultation of the heart reveals - No Murmurs and No Systolic Clicks.  Abdomen Inspection Inspection of the abdomen reveals - No Visible peristalsis and No Abnormal pulsations. Umbilicus - No Bleeding, No Urine drainage. Palpation/Percussion Palpation and Percussion of the abdomen reveal - Soft, Non Tender, No Rebound tenderness, No Rigidity (guarding) and No Cutaneous hyperesthesia. Note: Abdomen soft. Nontender, nondistended. No guarding. No hernias  Male Genitourinary Sexual Maturity Tanner 5 - Adult hair pattern and Adult penile size and shape. Note: Obvious right groin bulge. Reducible consistent with inguinal hernia. Left groin bulge on Valsalva. Somewhat medial. Suspicious for direct left inguinal hernia. Normal external genitalia. Epididymi, testes, and spermatic cords normal without any masses.  Peripheral Vascular Upper Extremity Inspection - Left - No Cyanotic nailbeds, Not Ischemic. Right - No Cyanotic nailbeds, Not Ischemic.  Neurologic Neurologic evaluation reveals -normal attention span and ability to concentrate, able to name objects and repeat phrases. Appropriate fund of knowledge , normal sensation and normal coordination. Mental Status Affect - not angry, not paranoid. Cranial Nerves-Normal Bilaterally. Gait-Normal.  Neuropsychiatric Mental status exam performed with findings of-able to articulate well with normal speech/language, rate, volume and coherence, thought content normal with ability to perform basic computations and apply abstract reasoning and no evidence of hallucinations, delusions, obsessions or homicidal/suicidal ideation.  Musculoskeletal Global Assessment Spine, Ribs and Pelvis - no instability, subluxation or laxity. Right Upper  Extremity - no instability, subluxation or laxity.  Lymphatic Head & Neck  General Head & Neck Lymphatics: Bilateral - Description - No Localized lymphadenopathy. Axillary  General Axillary Region: Bilateral - Description - No Localized lymphadenopathy. Femoral & Inguinal  Generalized Femoral & Inguinal Lymphatics: Left - Description - No Localized lymphadenopathy. Right - Description - No Localized lymphadenopathy.    Assessment & Plan Adin Hector MD; 05/07/2016 11:02 AM)  BILATERAL INGUINAL HERNIA WITHOUT OBSTRUCTION  OR GANGRENE, RECURRENCE NOT SPECIFIED (K40.20) Impression: Obvious right and subtle left inguinal hernias. I think he would benefit from repair given his decent health and activity level remains he's got a few more decades of life for the sling to be, a potential risk.  He does agree its gradually gotten larger. Not horribly symptomatic at this time. I do not think there is a major rush but I suspect he will benefit from surgery to fix it. Reasonable laparoscopic approach. He's inclined to try and get it done this summer. We'll work to coordinate a convenient time.  Operative some on the Flomax a week preoperatively to minimize the risk of urinary retention given the fact are going to be bilateral hernia repairs in a retired male  [place] (Z01.818)  Current Plans You are being scheduled for surgery - Our schedulers will call you.  You should hear from our office's scheduling department within 5 working days about the location, date, and time of surgery. We try to make accommodations for patient's preferences in scheduling surgery, but sometimes the OR schedule or the surgeon's schedule prevents [place] from making those accommodations.  If you have not heard from our office [phone]) in 5 working days, call the office and ask for your surgeon's nurse.  If you have other questions about your diagnosis, plan, or surgery, call the office  and ask for your surgeon's nurse.  Pt Education - Pamphlet Given - Laparoscopic Hernia Repair: discussed with patient and provided information. The anatomy & physiology of the abdominal wall and pelvic floor was discussed. The pathophysiology of hernias in the inguinal and pelvic region was discussed. Natural history risks such as progressive enlargement, pain, incarceration, and strangulation was discussed. Contributors to complications such as smoking, obesity, diabetes, prior surgery, etc were discussed.  I feel the risks of no intervention will lead to serious problems that outweigh the operative risks; therefore, I recommended surgery to reduce and repair the hernia. I explained laparoscopic techniques with possible need for an open approach. I noted usual use of mesh to patch and/or buttress hernia repair  Risks such as bleeding, infection, abscess, need for further treatment, heart attack, death, and other risks were discussed. I noted a good likelihood this will help address the problem. Goals of post-operative recovery were discussed as well. Possibility that this will not correct all symptoms was explained. I stressed the importance of low-impact activity, aggressive pain control, avoiding constipation, & not pushing through pain to minimize risk of post-operative chronic pain or injury. Possibility of reherniation was discussed. We will work to minimize complications.  An educational handout further explaining the pathology & treatment options was given as well. Questions were answered. The patient expresses understanding & wishes to proceed with surgery.  Pt Education - [facility] Hernia Post-Op HCI (Mylah Baynes): discussed with patient and provided information. Pt Education - [facility] Pain Control (Tinie Mcgloin) Started Flomax 0.4MG , 1 (one) Capsule daily, #14, 14 days starting 05/07/2016, Ref. x2. Local Order: to shrink the prostate and help with urination. Start one week before surgery. Continue another week  after surgery.  Adin Hector, M.D., F.A.C.S. Gastrointestinal and Minimally Invasive Surgery [facility] [address], [place], [place] [address] [phone] Main / Paging

## 2016-08-23 DIAGNOSIS — E7800 Pure hypercholesterolemia, unspecified: Secondary | ICD-10-CM | POA: Diagnosis not present

## 2016-08-23 DIAGNOSIS — Z1389 Encounter for screening for other disorder: Secondary | ICD-10-CM | POA: Diagnosis not present

## 2016-08-23 DIAGNOSIS — Z0000 Encounter for general adult medical examination without abnormal findings: Secondary | ICD-10-CM | POA: Diagnosis not present

## 2016-08-23 DIAGNOSIS — G4733 Obstructive sleep apnea (adult) (pediatric): Secondary | ICD-10-CM | POA: Diagnosis not present

## 2016-08-23 DIAGNOSIS — Z125 Encounter for screening for malignant neoplasm of prostate: Secondary | ICD-10-CM | POA: Diagnosis not present

## 2016-08-23 DIAGNOSIS — E1165 Type 2 diabetes mellitus with hyperglycemia: Secondary | ICD-10-CM | POA: Diagnosis not present

## 2016-08-23 DIAGNOSIS — M21372 Foot drop, left foot: Secondary | ICD-10-CM | POA: Diagnosis not present

## 2016-09-19 NOTE — Progress Notes (Signed)
Pt is being scheduled for preop appt; please place surgical orders in epic. Thanks.  

## 2016-09-20 ENCOUNTER — Ambulatory Visit: Payer: Self-pay | Admitting: Surgery

## 2016-09-20 NOTE — H&P (Signed)
Jermaine [name]  Location: [facility] Patient #: [id number] DOB: [birth year] Married / Language: English / Race: Black or African American Male   History of Present Illness  The patient is a 70 year old male who presents with an inguinal hernia. Note for "Inguinal hernia": Patient sent for surgical consultation at the request of Dr. Jori Moll polite. Concern for right inguinal hernia.  Pleasant active male. Has noticed a lump in his right groin for the past couple years. His optically pain for bothersome. He thinks it might be gotten slightly larger but no major changes. The point worried he has to push it back in. Feels gurgling with it. Was concerned. Discuss with his primary care physician. Concern for radical hernia. Recommendation made to consider surgical evaluation for possible repair.  Patient does not smoke. He is rather active. Walks regularly. Has bowel movement every day. He's never had any abdominal surgery. Not really on any medications. Denied any significant difficulty with urination or defecation. He thought he had a bulge at his belly button years ago but it went away. Does not feel it now. No discomfort on the left groin or bulging there that he is aware of.   Past Surgical History Marjean Donna, CMA; 05/07/2016 10:35 AM) Knee Surgery Left. Oral Surgery  Allergies Davy Pique Bynum, CMA; 05/07/2016 10:36 AM) Aspirin *ANALGESICS - NonNarcotic*  Medication History (Sonya Bynum, CMA; 05/07/2016 10:36 AM) No Current Medications Medications Reconciled  Social History Marjean Donna, CMA; 05/07/2016 10:35 AM) Alcohol use Moderate alcohol use. No caffeine use No drug use  Family History Marjean Donna, CMA; 05/07/2016 10:35 AM) Diabetes Mellitus Father.    Review of Systems Davy Pique Bynum CMA; 05/07/2016 10:35 AM) General Not Present- Appetite Loss, Chills, Fatigue, Fever, Night Sweats, Weight Gain and Weight Loss. Skin Not Present- Change in  Wart/Mole, Dryness, Hives, Jaundice, New Lesions, Non-Healing Wounds, Rash and Ulcer. HEENT Not Present- Earache, Hearing Loss, Hoarseness, Nose Bleed, Oral Ulcers, Ringing in the Ears, Seasonal Allergies, Sinus Pain, Sore Throat, Visual Disturbances, Wears glasses/contact lenses and Yellow Eyes. Respiratory Not Present- Bloody sputum, Chronic Cough, Difficulty Breathing, Snoring and Wheezing. Breast Not Present- Breast Mass, Breast Pain, Nipple Discharge and Skin Changes. Cardiovascular Not Present- Chest Pain, Difficulty Breathing Lying Down, Leg Cramps, Palpitations, Rapid Heart Rate, Shortness of Breath and Swelling of Extremities. Gastrointestinal Not Present- Abdominal Pain, Bloating, Bloody Stool, Change in Bowel Habits, Chronic diarrhea, Constipation, Difficulty Swallowing, Excessive gas, Gets full quickly at meals, Hemorrhoids, Indigestion, Nausea, Rectal Pain and Vomiting. Male Genitourinary Not Present- Blood in Urine, Change in Urinary Stream, Frequency, Impotence, Nocturia, Painful Urination, Urgency and Urine Leakage. Musculoskeletal Not Present- Back Pain, Joint Pain, Joint Stiffness, Muscle Pain, Muscle Weakness and Swelling of Extremities. Neurological Not Present- Decreased Memory, Fainting, Headaches, Numbness, Seizures, Tingling, Tremor, Trouble walking and Weakness. Psychiatric Not Present- Anxiety, Bipolar, Change in Sleep Pattern, Depression, Fearful and Frequent crying. Endocrine Present- New Diabetes. Not Present- Cold Intolerance, Excessive Hunger, Hair Changes, Heat Intolerance and Hot flashes.  Vitals (Sonya Bynum CMA; 05/07/2016 10:35 AM) 05/07/2016 10:35 AM Weight: 209 lb Height: 70in Body Surface Area: 2.13 m Body Mass Index: 29.99 kg/m  Temp.: 34F(Temporal)  Pulse: 78 (Regular)  BP: 126/76 (Sitting, Left Arm, Standard)       Physical Exam Adin Hector MD; 05/07/2016 10:58 AM) General Mental Status-Alert. General Appearance-Not in acute  distress, Not Sickly. Orientation-Oriented X3. Hydration-Well hydrated. Voice-Normal.  Integumentary Global Assessment Upon inspection and palpation of skin surfaces of the - Axillae: non-tender, no  inflammation or ulceration, no drainage. and Distribution of scalp and body hair is normal. General Characteristics Temperature - normal warmth is noted.  Head and Neck Head-normocephalic, atraumatic with no lesions or palpable masses. Face Global Assessment - atraumatic, no absence of expression. Neck Global Assessment - no abnormal movements, no bruit auscultated on the right, no bruit auscultated on the left, no decreased range of motion, non-tender. Trachea-midline. Thyroid Gland Characteristics - non-tender.  Eye Eyeball - Left-Extraocular movements intact, No Nystagmus. Eyeball - Right-Extraocular movements intact, No Nystagmus. Cornea - Left-No Hazy. Cornea - Right-No Hazy. Sclera/Conjunctiva - Left-No scleral icterus, No Discharge. Sclera/Conjunctiva - Right-No scleral icterus, No Discharge. Pupil - Left-Direct reaction to light normal. Pupil - Right-Direct reaction to light normal.  ENMT Ears Pinna - Left - no drainage observed, no generalized tenderness observed. Right - no drainage observed, no generalized tenderness observed. Nose and Sinuses External Inspection of the Nose - no destructive lesion observed. Inspection of the nares - Left - quiet respiration. Right - quiet respiration. Mouth and Throat Lips - Upper Lip - no fissures observed, no pallor noted. Lower Lip - no fissures observed, no pallor noted. Nasopharynx - no discharge present. Oral Cavity/Oropharynx - Tongue - no dryness observed. Oral Mucosa - no cyanosis observed. Hypopharynx - no evidence of airway distress observed.  Chest and Lung Exam Inspection Movements - Normal and Symmetrical. Accessory muscles - No use of accessory muscles in breathing. Palpation Palpation of the  chest reveals - Non-tender. Auscultation Breath sounds - Normal and Clear.  Cardiovascular Auscultation Rhythm - Regular. Murmurs & Other Heart Sounds - Auscultation of the heart reveals - No Murmurs and No Systolic Clicks.  Abdomen Inspection Inspection of the abdomen reveals - No Visible peristalsis and No Abnormal pulsations. Umbilicus - No Bleeding, No Urine drainage. Palpation/Percussion Palpation and Percussion of the abdomen reveal - Soft, Non Tender, No Rebound tenderness, No Rigidity (guarding) and No Cutaneous hyperesthesia. Note: Abdomen soft. Nontender, nondistended. No guarding. No hernias   Male Genitourinary Sexual Maturity Tanner 5 - Adult hair pattern and Adult penile size and shape. Note: Obvious right groin bulge. Reducible consistent with inguinal hernia. Left groin bulge on Valsalva. Somewhat medial. Suspicious for direct left inguinal hernia. Normal external genitalia. Epididymi, testes, and spermatic cords normal without any masses.   Peripheral Vascular Upper Extremity Inspection - Left - No Cyanotic nailbeds, Not Ischemic. Right - No Cyanotic nailbeds, Not Ischemic.  Neurologic Neurologic evaluation reveals -normal attention span and ability to concentrate, able to name objects and repeat phrases. Appropriate fund of knowledge , normal sensation and normal coordination. Mental Status Affect - not angry, not paranoid. Cranial Nerves-Normal Bilaterally. Gait-Normal.  Neuropsychiatric Mental status exam performed with findings of-able to articulate well with normal speech/language, rate, volume and coherence, thought content normal with ability to perform basic computations and apply abstract reasoning and no evidence of hallucinations, delusions, obsessions or homicidal/suicidal ideation.  Musculoskeletal Global Assessment Spine, Ribs and Pelvis - no instability, subluxation or laxity. Right Upper Extremity - no instability, subluxation or  laxity.  Lymphatic Head & Neck  General Head & Neck Lymphatics: Bilateral - Description - No Localized lymphadenopathy. Axillary  General Axillary Region: Bilateral - Description - No Localized lymphadenopathy. Femoral & Inguinal  Generalized Femoral & Inguinal Lymphatics: Left - Description - No Localized lymphadenopathy. Right - Description - No Localized lymphadenopathy.    Assessment & Plan   BILATERAL INGUINAL HERNIA WITHOUT OBSTRUCTION OR GANGRENE, RECURRENCE NOT SPECIFIED (K40.20) Impression: Obvious right and subtle left  inguinal hernias. I think he would benefit from repair given his decent health and activity level remains he's got a few more decades of life for the sling to be, a potential risk.  He does agree its gradually gotten larger. Not horribly symptomatic at this time. I do not think there is a major rush but I suspect he will benefit from surgery to fix it. Reasonable laparoscopic approach. He's inclined to try and get it done this summer. We'll work to coordinate a convenient time.  Operative some on the Flomax a week preoperatively to minimize the risk of urinary retention given the fact are going to be bilateral hernia repairs in a retired male  [place] (Z01.818) Current Plans You are being scheduled for surgery - Our schedulers will call you.  You should hear from our office's scheduling department within 5 working days about the location, date, and time of surgery. We try to make accommodations for patient's preferences in scheduling surgery, but sometimes the OR schedule or the surgeon's schedule prevents [place] from making those accommodations.  If you have not heard from our office [phone]) in 5 working days, call the office and ask for your surgeon's nurse.  If you have other questions about your diagnosis, plan, or surgery, call the office and ask for your surgeon's nurse.  Pt Education - Pamphlet Given - Laparoscopic  Hernia Repair: discussed with patient and provided information. The anatomy & physiology of the abdominal wall and pelvic floor was discussed. The pathophysiology of hernias in the inguinal and pelvic region was discussed. Natural history risks such as progressive enlargement, pain, incarceration, and strangulation was discussed. Contributors to complications such as smoking, obesity, diabetes, prior surgery, etc were discussed.  I feel the risks of no intervention will lead to serious problems that outweigh the operative risks; therefore, I recommended surgery to reduce and repair the hernia. I explained laparoscopic techniques with possible need for an open approach. I noted usual use of mesh to patch and/or buttress hernia repair  Risks such as bleeding, infection, abscess, need for further treatment, heart attack, death, and other risks were discussed. I noted a good likelihood this will help address the problem. Goals of post-operative recovery were discussed as well. Possibility that this will not correct all symptoms was explained. I stressed the importance of low-impact activity, aggressive pain control, avoiding constipation, & not pushing through pain to minimize risk of post-operative chronic pain or injury. Possibility of reherniation was discussed. We will work to minimize complications.  An educational handout further explaining the pathology & treatment options was given as well. Questions were answered. The patient expresses understanding & wishes to proceed with surgery.  Pt Education - [facility] Hernia Post-Op HCI (Burkley Dech): discussed with patient and provided information. Pt Education - [facility] Pain Control (Chyenne Sobczak) Started Flomax 0.4MG , 1 (one) Capsule daily, #14, 14 days starting 05/07/2016, Ref. x2. Local Order: to shrink the prostate and help with urination. Start one week before surgery. Continue another week after surgery.   Adin Hector, M.D., F.A.C.S. Gastrointestinal and Minimally  Invasive Surgery [facility] [address] [place], [place] [address] [phone] Main / Paging

## 2016-10-22 ENCOUNTER — Encounter (HOSPITAL_COMMUNITY): Payer: Self-pay

## 2016-10-22 NOTE — Patient Instructions (Addendum)
Jermaine [name]  10/22/2016   Your procedure is scheduled on: 10-28-16  Report to [facility] Main  Entrance take [facility]  elevators to 3rd floor to  [name] at  [place] AM.  Call this number if you have problems the morning of surgery [phone]   Remember: ONLY 1 PERSON MAY GO WITH YOU TO SHORT STAY TO GET  READY MORNING OF Jermaine [name].   Do not eat food or drink liquids :After Midnight.     Take these medicines the morning of surgery with A SIP OF WATER: None.                                You may not have any metal on your body including hair pins and              piercings  Do not wear jewelry, make-up, lotions, powders or perfumes, deodorant             Do not wear nail polish.  Do not shave  48 hours prior to surgery.              Men may shave face and neck.   Do not bring valuables to the hospital. Jermaine [name].  Contacts, dentures or bridgework may not be worn into surgery.  Leave suitcase in the car. After surgery it may be brought to your room.     Patients discharged the day of surgery will not be allowed to drive home.  Name and phone number of your driver:spouse-Jermaine [name]  Special Instructions: N/A              Please read over the following fact sheets you were given: _____________________________________________________________________             [facility] - Preparing for Surgery Before surgery, you can play an important role.  Because skin is not sterile, your skin needs to be as free of germs as possible.  You can reduce the number of germs on your skin by washing with CHG (chlorahexidine gluconate) soap before surgery.  CHG is an antiseptic cleaner which kills germs and bonds with the skin to continue killing germs even after washing. Please DO NOT use if you have an allergy to CHG or antibacterial soaps.  If your skin becomes reddened/irritated stop using the CHG and inform your  nurse when you arrive at Short Stay. Do not shave (including legs and underarms) for at least 48 hours prior to the first CHG shower.  You may shave your face/neck. Please follow these instructions carefully:  1.  Shower with CHG Soap the night before surgery and the  morning of Surgery.  2.  If you choose to wash your hair, wash your hair first as usual with your  normal  shampoo.  3.  After you shampoo, rinse your hair and body thoroughly to remove the  shampoo.                           4.  Use CHG as you would any other liquid soap.  You can apply chg directly  to the skin and wash  Gently with a scrungie or clean washcloth.  5.  Apply the CHG Soap to your body ONLY FROM THE NECK DOWN.   Do not use on face/ open                           Wound or open sores. Avoid contact with eyes, ears mouth and genitals (private parts).                       Wash face,  Genitals (private parts) with your normal soap.             6.  Wash thoroughly, paying special attention to the area where your surgery  will be performed.  7.  Thoroughly rinse your body with warm water from the neck down.  8.  DO NOT shower/wash with your normal soap after using and rinsing off  the CHG Soap.                9.  Pat yourself dry with a clean towel.            10.  Wear clean pajamas.            11.  Place clean sheets on your bed the night of your first shower and do not  sleep with pets. Day of Surgery : Do not apply any lotions/deodorants the morning of surgery.  Please wear clean clothes to the hospital/surgery center.  FAILURE TO FOLLOW THESE INSTRUCTIONS MAY RESULT IN THE CANCELLATION OF YOUR SURGERY PATIENT SIGNATURE_________________________________  NURSE SIGNATURE__________________________________  ________________________________________________________________________

## 2016-10-24 ENCOUNTER — Encounter (HOSPITAL_COMMUNITY): Payer: Self-pay

## 2016-10-24 ENCOUNTER — Encounter (HOSPITAL_COMMUNITY)
Admission: RE | Admit: 2016-10-24 | Discharge: 2016-10-24 | Disposition: A | Discharge status: Home / Self Care | Payer: PPO | Source: Ambulatory Visit | Attending: Surgery | Admitting: Surgery

## 2016-10-24 DIAGNOSIS — Z01810 Encounter for preprocedural cardiovascular examination: Secondary | ICD-10-CM | POA: Insufficient documentation

## 2016-10-24 LAB — CBC
HCT: 42.7 % (ref 39.0–52.0)
Hemoglobin: 15.5 g/dL (ref 13.0–17.0)
MCH: 32.8 pg (ref 26.0–34.0)
MCHC: 36.3 g/dL — ABNORMAL HIGH (ref 30.0–36.0)
MCV: 90.3 fL (ref 78.0–100.0)
PLATELETS: 136 10*3/uL — AB (ref 150–400)
RBC: 4.73 MIL/uL (ref 4.22–5.81)
RDW: 12.2 % (ref 11.5–15.5)
WBC: 3.1 10*3/uL — AB (ref 4.0–10.5)

## 2016-10-28 ENCOUNTER — Encounter (HOSPITAL_COMMUNITY)
Admission: RE | Disposition: A | Discharge status: Home / Self Care | Payer: Self-pay | Source: Ambulatory Visit | Attending: Surgery

## 2016-10-28 ENCOUNTER — Ambulatory Visit (HOSPITAL_COMMUNITY): Payer: PPO | Admitting: Certified Registered Nurse Anesthetist

## 2016-10-28 ENCOUNTER — Encounter (HOSPITAL_COMMUNITY): Payer: Self-pay | Admitting: *Deleted

## 2016-10-28 ENCOUNTER — Ambulatory Visit (HOSPITAL_COMMUNITY)
Admission: RE | Admit: 2016-10-28 | Discharge: 2016-10-28 | Disposition: A | Discharge status: Home / Self Care | Payer: PPO | Source: Ambulatory Visit | Attending: Surgery | Admitting: Surgery

## 2016-10-28 DIAGNOSIS — G4730 Sleep apnea, unspecified: Secondary | ICD-10-CM | POA: Insufficient documentation

## 2016-10-28 DIAGNOSIS — Z886 Allergy status to analgesic agent status: Secondary | ICD-10-CM | POA: Diagnosis not present

## 2016-10-28 DIAGNOSIS — D176 Benign lipomatous neoplasm of spermatic cord: Secondary | ICD-10-CM | POA: Diagnosis not present

## 2016-10-28 DIAGNOSIS — E119 Type 2 diabetes mellitus without complications: Secondary | ICD-10-CM | POA: Insufficient documentation

## 2016-10-28 DIAGNOSIS — K4020 Bilateral inguinal hernia, without obstruction or gangrene, not specified as recurrent: Secondary | ICD-10-CM | POA: Insufficient documentation

## 2016-10-28 DIAGNOSIS — Z833 Family history of diabetes mellitus: Secondary | ICD-10-CM | POA: Diagnosis not present

## 2016-10-28 DIAGNOSIS — G4733 Obstructive sleep apnea (adult) (pediatric): Secondary | ICD-10-CM | POA: Diagnosis not present

## 2016-10-28 DIAGNOSIS — R0600 Dyspnea, unspecified: Secondary | ICD-10-CM | POA: Diagnosis not present

## 2016-10-28 DIAGNOSIS — M4800 Spinal stenosis, site unspecified: Secondary | ICD-10-CM | POA: Diagnosis not present

## 2016-10-28 HISTORY — PX: INGUINAL HERNIA REPAIR: SHX194

## 2016-10-28 HISTORY — PX: INSERTION OF MESH: SHX5868

## 2016-10-28 SURGERY — REPAIR, HERNIA, INGUINAL, BILATERAL, LAPAROSCOPIC
Anesthesia: General | Laterality: Bilateral

## 2016-10-28 MED ORDER — MIDAZOLAM HCL 5 MG/5ML IJ SOLN
INTRAMUSCULAR | Status: DC | PRN
  Administered 2016-10-28: 2 mg via INTRAVENOUS

## 2016-10-28 MED ORDER — MEPERIDINE HCL 50 MG/ML IJ SOLN: 6.2500 mg | INTRAMUSCULAR | Status: DC | PRN

## 2016-10-28 MED ORDER — ROCURONIUM BROMIDE 10 MG/ML (PF) SYRINGE
PREFILLED_SYRINGE | INTRAVENOUS | Status: DC | PRN
  Administered 2016-10-28: 50 mg via INTRAVENOUS
  Administered 2016-10-28: 20 mg via INTRAVENOUS

## 2016-10-28 MED ORDER — MIDAZOLAM HCL 2 MG/2ML IJ SOLN
INTRAMUSCULAR | Status: AC
  Filled 2016-10-28: qty 2

## 2016-10-28 MED ORDER — SUGAMMADEX SODIUM 200 MG/2ML IV SOLN
INTRAVENOUS | Status: DC | PRN
  Administered 2016-10-28: 200 mg via INTRAVENOUS

## 2016-10-28 MED ORDER — PROPOFOL 10 MG/ML IV BOLUS
INTRAVENOUS | Status: AC
  Filled 2016-10-28: qty 20

## 2016-10-28 MED ORDER — PROPOFOL 10 MG/ML IV BOLUS
INTRAVENOUS | Status: DC | PRN
  Administered 2016-10-28: 200 mg via INTRAVENOUS

## 2016-10-28 MED ORDER — BUPIVACAINE HCL (PF) 0.25 % IJ SOLN
INTRAMUSCULAR | Status: DC | PRN
  Administered 2016-10-28: 60 mL

## 2016-10-28 MED ORDER — TRAMADOL HCL 50 MG PO TABS: 50.0000 mg | ORAL_TABLET | Freq: Four times a day (QID) | ORAL | 0 refills | Status: AC | PRN

## 2016-10-28 MED ORDER — FENTANYL CITRATE (PF) 100 MCG/2ML IJ SOLN: 25.0000 ug | INTRAMUSCULAR | Status: DC | PRN

## 2016-10-28 MED ORDER — CEFAZOLIN SODIUM-DEXTROSE 2-4 GM/100ML-% IV SOLN
2.0000 g | INTRAVENOUS | Status: AC
  Administered 2016-10-28: 2 g via INTRAVENOUS

## 2016-10-28 MED ORDER — ROCURONIUM BROMIDE 50 MG/5ML IV SOSY
PREFILLED_SYRINGE | INTRAVENOUS | Status: AC
  Filled 2016-10-28: qty 5

## 2016-10-28 MED ORDER — GABAPENTIN 300 MG PO CAPS
300.0000 mg | ORAL_CAPSULE | ORAL | Status: AC
  Administered 2016-10-28: 300 mg via ORAL
  Filled 2016-10-28: qty 1

## 2016-10-28 MED ORDER — ACETAMINOPHEN 500 MG PO TABS
1000.0000 mg | ORAL_TABLET | ORAL | Status: AC
  Administered 2016-10-28: 1000 mg via ORAL
  Filled 2016-10-28: qty 2

## 2016-10-28 MED ORDER — LACTATED RINGERS IV SOLN
INTRAVENOUS | Status: DC | PRN
  Administered 2016-10-28 (×2): via INTRAVENOUS

## 2016-10-28 MED ORDER — PROMETHAZINE HCL 25 MG/ML IJ SOLN: 6.2500 mg | INTRAMUSCULAR | Status: DC | PRN

## 2016-10-28 MED ORDER — CEFAZOLIN SODIUM-DEXTROSE 2-4 GM/100ML-% IV SOLN
INTRAVENOUS | Status: AC
Start: 2016-10-28 — End: 2016-10-28
  Filled 2016-10-28: qty 100

## 2016-10-28 MED ORDER — FENTANYL CITRATE (PF) 250 MCG/5ML IJ SOLN
INTRAMUSCULAR | Status: AC
  Filled 2016-10-28: qty 5

## 2016-10-28 MED ORDER — MIDAZOLAM HCL 2 MG/2ML IJ SOLN: 0.5000 mg | Freq: Once | INTRAMUSCULAR | Status: DC | PRN

## 2016-10-28 MED ORDER — BUPIVACAINE HCL (PF) 0.25 % IJ SOLN
INTRAMUSCULAR | Status: AC
  Filled 2016-10-28: qty 60

## 2016-10-28 MED ORDER — SODIUM CHLORIDE 0.9 % IR SOLN
Status: DC | PRN
  Administered 2016-10-28: 1

## 2016-10-28 MED ORDER — ONDANSETRON HCL 4 MG/2ML IJ SOLN
INTRAMUSCULAR | Status: AC
  Filled 2016-10-28: qty 2

## 2016-10-28 MED ORDER — ONDANSETRON HCL 4 MG/2ML IJ SOLN
INTRAMUSCULAR | Status: DC | PRN
  Administered 2016-10-28: 4 mg via INTRAVENOUS

## 2016-10-28 MED ORDER — FENTANYL CITRATE (PF) 100 MCG/2ML IJ SOLN
INTRAMUSCULAR | Status: DC | PRN
  Administered 2016-10-28: 50 ug via INTRAVENOUS
  Administered 2016-10-28: 100 ug via INTRAVENOUS
  Administered 2016-10-28 (×2): 50 ug via INTRAVENOUS

## 2016-10-28 MED ORDER — SUCCINYLCHOLINE CHLORIDE 20 MG/ML IJ SOLN
INTRAMUSCULAR | Status: DC | PRN
  Administered 2016-10-28: 100 mg via INTRAVENOUS

## 2016-10-28 SURGICAL SUPPLY — 31 items
CABLE HIGH FREQUENCY MONO STRZ (ELECTRODE) ×3 IMPLANT
CHLORAPREP W/TINT 26ML (MISCELLANEOUS) ×3 IMPLANT
COVER SURGICAL LIGHT HANDLE (MISCELLANEOUS) ×3 IMPLANT
DECANTER SPIKE VIAL GLASS SM (MISCELLANEOUS) ×3 IMPLANT
DEVICE SECURE STRAP 25 ABSORB (INSTRUMENTS) IMPLANT
DRAPE WARM FLUID 44X44 (DRAPE) ×3 IMPLANT
DRSG TEGADERM 2-3/8X2-3/4 SM (GAUZE/BANDAGES/DRESSINGS) ×6 IMPLANT
DRSG TEGADERM 4X4.75 (GAUZE/BANDAGES/DRESSINGS) ×3 IMPLANT
ELECT REM PT RETURN 9FT ADLT (ELECTROSURGICAL) ×3
ELECTRODE REM PT RTRN 9FT ADLT (ELECTROSURGICAL) ×1 IMPLANT
GAUZE SPONGE 2X2 8PLY STRL LF (GAUZE/BANDAGES/DRESSINGS) ×1 IMPLANT
GLOVE ECLIPSE 8.0 STRL XLNG CF (GLOVE) ×3 IMPLANT
GLOVE INDICATOR 8.0 STRL GRN (GLOVE) ×3 IMPLANT
GOWN STRL REUS W/TWL XL LVL3 (GOWN DISPOSABLE) ×6 IMPLANT
IRRIG SUCT STRYKERFLOW 2 WTIP (MISCELLANEOUS)
IRRIGATION SUCT STRKRFLW 2 WTP (MISCELLANEOUS) IMPLANT
KIT BASIN OR (CUSTOM PROCEDURE TRAY) ×3 IMPLANT
MESH ULTRAPRO 6X6 15CM15CM (Mesh General) ×9 IMPLANT
PAD POSITIONING PINK XL (MISCELLANEOUS) ×3 IMPLANT
SCISSORS LAP 5X35 DISP (ENDOMECHANICALS) ×3 IMPLANT
SLEEVE ADV FIXATION 5X100MM (TROCAR) ×3 IMPLANT
SPONGE GAUZE 2X2 STER 10/PKG (GAUZE/BANDAGES/DRESSINGS) ×2
SUT MNCRL AB 4-0 PS2 18 (SUTURE) ×3 IMPLANT
SUT VIC AB 2-0 SH 27 (SUTURE) ×2
SUT VIC AB 2-0 SH 27X BRD (SUTURE) ×1 IMPLANT
TACKER 5MM HERNIA 3.5CML NAB (ENDOMECHANICALS) IMPLANT
TOWEL OR 17X26 10 PK STRL BLUE (TOWEL DISPOSABLE) ×3 IMPLANT
TRAY LAPAROSCOPIC (CUSTOM PROCEDURE TRAY) ×3 IMPLANT
TROCAR ADV FIXATION 5X100MM (TROCAR) ×3 IMPLANT
TROCAR XCEL BLUNT TIP 100MML (ENDOMECHANICALS) ×3 IMPLANT
TUBING INSUF HEATED (TUBING) ×3 IMPLANT

## 2016-10-28 NOTE — H&P (Signed)
Rod Can 05/07/2016 10:34 AM Location: [facility] Patient #: [id number] DOB: August 15, 1946 Married / Language: English / Race: Black or African American Male  Patient Care Team: Seward Carol, MD as PCP - General (Internal Medicine) Michael Boston, MD as Consulting Physician (General Surgery)   History of Present Illness  The patient is a 70 year old male who presents for surgical consultation at the request of Dr. Jori Moll polite. Concern for right inguinal hernia.  Pleasant active male. Has noticed a lump in his right groin for the past couple years. His optically pain for bothersome. He thinks it might be gotten slightly larger but no major changes. The point worried he has to push it back in. Feels gurgling with it. Was concerned. Discuss with his primary care physician. Concern for radical hernia. Recommendation made to consider surgical evaluation for possible repair.  Patient does not smoke. He is rather active. Walks regularly. Has bowel movement every day. He's never had any abdominal surgery. Not really on any medications. Denied any significant difficulty with urination or defecation. He thought he had a bulge at his belly button years ago but it went away. Does not feel it now. No discomfort on the left groin or bulging there that he is aware of.  No new events since seen a few months ago.  Past Surgical History Marjean Donna, CMA; 05/07/2016 10:35 AM) Knee Surgery Left. Oral Surgery  Allergies Davy Pique Bynum, CMA; 05/07/2016 10:36 AM) Aspirin *ANALGESICS - NonNarcotic*  Medication History (Sonya Bynum, CMA; 05/07/2016 10:36 AM) No Current Medications Medications Reconciled  Social History Marjean Donna, CMA; 05/07/2016 10:35 AM) Alcohol use Moderate alcohol use. No caffeine use No drug use  Family History Marjean Donna, CMA; 05/07/2016 10:35 AM) Diabetes Mellitus Father.    Review of Systems Davy Pique Bynum CMA; 05/07/2016 10:35  AM) General Not Present- Appetite Loss, Chills, Fatigue, Fever, Night Sweats, Weight Gain and Weight Loss. Skin Not Present- Change in Wart/Mole, Dryness, Hives, Jaundice, New Lesions, Non-Healing Wounds, Rash and Ulcer. HEENT Not Present- Earache, Hearing Loss, Hoarseness, Nose Bleed, Oral Ulcers, Ringing in the Ears, Seasonal Allergies, Sinus Pain, Sore Throat, Visual Disturbances, Wears glasses/contact lenses and Yellow Eyes. Respiratory Not Present- Bloody sputum, Chronic Cough, Difficulty Breathing, Snoring and Wheezing. Breast Not Present- Breast Mass, Breast Pain, Nipple Discharge and Skin Changes. Cardiovascular Not Present- Chest Pain, Difficulty Breathing Lying Down, Leg Cramps, Palpitations, Rapid Heart Rate, Shortness of Breath and Swelling of Extremities. Gastrointestinal Not Present- Abdominal Pain, Bloating, Bloody Stool, Change in Bowel Habits, Chronic diarrhea, Constipation, Difficulty Swallowing, Excessive gas, Gets full quickly at meals, Hemorrhoids, Indigestion, Nausea, Rectal Pain and Vomiting. Male Genitourinary Not Present- Blood in Urine, Change in Urinary Stream, Frequency, Impotence, Nocturia, Painful Urination, Urgency and Urine Leakage. Musculoskeletal Not Present- Back Pain, Joint Pain, Joint Stiffness, Muscle Pain, Muscle Weakness and Swelling of Extremities. Neurological Not Present- Decreased Memory, Fainting, Headaches, Numbness, Seizures, Tingling, Tremor, Trouble walking and Weakness. Psychiatric Not Present- Anxiety, Bipolar, Change in Sleep Pattern, Depression, Fearful and Frequent crying. Endocrine Present- New Diabetes. Not Present- Cold Intolerance, Excessive Hunger, Hair Changes, Heat Intolerance and Hot flashes.  Vitals (Sonya Bynum CMA; 05/07/2016 10:35 AM) 05/07/2016 10:35 AM Weight: 209 lb Height: 70in Body Surface Area: 2.13 m Body Mass Index: 29.99 kg/m  Temp.: 44F(Temporal)  Pulse: 78 (Regular)  BP: 126/76 (Sitting, Left Arm,  Standard)   BP 120/76   Pulse 60   Temp 98.1 F (36.7 C) (Oral)   Resp 18   Ht 6' (1.829  m)   Wt 97.1 kg (214 lb)   SpO2 98%   BMI 29.02 kg/m      Physical Exam Adin Hector MD; 05/07/2016 10:58 AM) General Mental Status-Alert. General Appearance-Not in acute distress, Not Sickly. Orientation-Oriented X3. Hydration-Well hydrated. Voice-Normal.  Integumentary Global Assessment Upon inspection and palpation of skin surfaces of the - Axillae: non-tender, no inflammation or ulceration, no drainage. and Distribution of scalp and body hair is normal. General Characteristics Temperature - normal warmth is noted.  Head and Neck Head-normocephalic, atraumatic with no lesions or palpable masses. Face Global Assessment - atraumatic, no absence of expression. Neck Global Assessment - no abnormal movements, no bruit auscultated on the right, no bruit auscultated on the left, no decreased range of motion, non-tender. Trachea-midline. Thyroid Gland Characteristics - non-tender.  Eye Eyeball - Left-Extraocular movements intact, No Nystagmus. Eyeball - Right-Extraocular movements intact, No Nystagmus. Cornea - Left-No Hazy. Cornea - Right-No Hazy. Sclera/Conjunctiva - Left-No scleral icterus, No Discharge. Sclera/Conjunctiva - Right-No scleral icterus, No Discharge. Pupil - Left-Direct reaction to light normal. Pupil - Right-Direct reaction to light normal.  ENMT Ears Pinna - Left - no drainage observed, no generalized tenderness observed. Right - no drainage observed, no generalized tenderness observed. Nose and Sinuses External Inspection of the Nose - no destructive lesion observed. Inspection of the nares - Left - quiet respiration. Right - quiet respiration. Mouth and Throat Lips - Upper Lip - no fissures observed, no pallor noted. Lower Lip - no fissures observed, no pallor noted. Nasopharynx - no discharge present. Oral Cavity/Oropharynx  - Tongue - no dryness observed. Oral Mucosa - no cyanosis observed. Hypopharynx - no evidence of airway distress observed.  Chest and Lung Exam Inspection Movements - Normal and Symmetrical. Accessory muscles - No use of accessory muscles in breathing. Palpation Palpation of the chest reveals - Non-tender. Auscultation Breath sounds - Normal and Clear.  Cardiovascular Auscultation Rhythm - Regular. Murmurs & Other Heart Sounds - Auscultation of the heart reveals - No Murmurs and No Systolic Clicks.  Abdomen Inspection Inspection of the abdomen reveals - No Visible peristalsis and No Abnormal pulsations. Umbilicus - No Bleeding, No Urine drainage. Palpation/Percussion Palpation and Percussion of the abdomen reveal - Soft, Non Tender, No Rebound tenderness, No Rigidity (guarding) and No Cutaneous hyperesthesia. Note: Abdomen soft. Nontender, nondistended. No guarding. No hernias   Male Genitourinary Sexual Maturity Tanner 5 - Adult hair pattern and Adult penile size and shape. Note: Obvious right groin bulge. Reducible consistent with inguinal hernia. Left groin bulge on Valsalva. Somewhat medial. Suspicious for direct left inguinal hernia. Normal external genitalia. Epididymi, testes, and spermatic cords normal without any masses.   Peripheral Vascular Upper Extremity Inspection - Left - No Cyanotic nailbeds, Not Ischemic. Right - No Cyanotic nailbeds, Not Ischemic.  Neurologic Neurologic evaluation reveals -normal attention span and ability to concentrate, able to name objects and repeat phrases. Appropriate fund of knowledge , normal sensation and normal coordination. Mental Status Affect - not angry, not paranoid. Cranial Nerves-Normal Bilaterally. Gait-Normal.  Neuropsychiatric Mental status exam performed with findings of-able to articulate well with normal speech/language, rate, volume and coherence, thought content normal with ability to perform basic  computations and apply abstract reasoning and no evidence of hallucinations, delusions, obsessions or homicidal/suicidal ideation.  Musculoskeletal Global Assessment Spine, Ribs and Pelvis - no instability, subluxation or laxity. Right Upper Extremity - no instability, subluxation or laxity.  Lymphatic Head & Neck  General Head & Neck Lymphatics: Bilateral - Description -  No Localized lymphadenopathy. Axillary  General Axillary Region: Bilateral - Description - No Localized lymphadenopathy. Femoral & Inguinal  Generalized Femoral & Inguinal Lymphatics: Left - Description - No Localized lymphadenopathy. Right - Description - No Localized lymphadenopathy.    Assessment & Plan\  BILATERAL INGUINAL HERNIA WITHOUT OBSTRUCTION OR GANGRENE, RECURRENCE NOT SPECIFIED (K40.20) Impression: Obvious right and subtle left inguinal hernias. I think he would benefit from repair given his decent health and activity level remains he's got a few more decades of life for the sling to be, a potential risk.  He does agree its gradually gotten larger. Not horribly symptomatic at this time. I do not think there is a major rush but I suspect he will benefit from surgery to fix it. Reasonable laparoscopic approach. He's inclined to try and get it done this summer. We'll work to coordinate a convenient time.  Operative some on the Flomax a week preoperatively to minimize the risk of urinary retention given the fact are going to be bilateral hernia repairs in a retired male [place] (Z01.818) Current Plans You are being scheduled for surgery - Our schedulers will call you.  You should hear from our office's scheduling department within 5 working days about the location, date, and time of surgery. We try to make accommodations for patient's preferences in scheduling surgery, but sometimes the OR schedule or the surgeon's schedule prevents [place] from making those accommodations.  If you  have not heard from our office [phone]) in 5 working days, call the office and ask for your surgeon's nurse.  If you have other questions about your diagnosis, plan, or surgery, call the office and ask for your surgeon's nurse.  Pt Education - Pamphlet Given - Laparoscopic Hernia Repair: discussed with patient and provided information. The anatomy & physiology of the abdominal wall and pelvic floor was discussed. The pathophysiology of hernias in the inguinal and pelvic region was discussed. Natural history risks such as progressive enlargement, pain, incarceration, and strangulation was discussed. Contributors to complications such as smoking, obesity, diabetes, prior surgery, etc were discussed.  I feel the risks of no intervention will lead to serious problems that outweigh the operative risks; therefore, I recommended surgery to reduce and repair the hernia. I explained laparoscopic techniques with possible need for an open approach. I noted usual use of mesh to patch and/or buttress hernia repair  Risks such as bleeding, infection, abscess, need for further treatment, heart attack, death, and other risks were discussed. I noted a good likelihood this will help address the problem. Goals of post-operative recovery were discussed as well. Possibility that this will not correct all symptoms was explained. I stressed the importance of low-impact activity, aggressive pain control, avoiding constipation, & not pushing through pain to minimize risk of post-operative chronic pain or injury. Possibility of reherniation was discussed. We will work to minimize complications.  An educational handout further explaining the pathology & treatment options was given as well. Questions were answered. The patient expresses understanding & wishes to proceed with surgery.  Pt Education - [facility] Hernia Post-Op HCI (Omarius Grantham): discussed with patient and provided information. Pt Education - [facility] Pain Control  (Evola Hollis) Started Flomax 0.4MG , 1 (one) Capsule daily, #14, 14 days starting 05/07/2016, Ref. x2. Local Order: to shrink the prostate and help with urination. Start one week before surgery. Continue another week after surgery.   I have re-reviewed the the patient's records, history, medications, and allergies.  I have re-examined the patient.  I again discussed intraoperative plans and goals of post-operative recovery.  The patient agrees to proceed.  Adin Hector, M.D., F.A.C.S. Gastrointestinal and Minimally Invasive Surgery [facility] [address] [place], [place] [id number] [phone] Main / Paging

## 2016-10-28 NOTE — Interval H&P Note (Signed)
History and Physical Interval Note:  10/28/2016 7:20 AM  Jermaine [name]  has presented today for surgery, with the diagnosis of BILATERAL INGUINAL HERNIA  The various methods of treatment have been discussed with the patient and family. After consideration of risks, benefits and other options for treatment, the patient has consented to  Procedure(s): [place] (Bilateral) INSERTION OF MESH (Bilateral) as a surgical intervention .  The patient's history has been reviewed, patient examined, no change in status, stable for surgery.  I have reviewed the patient's chart and labs.  Questions were answered to the patient's satisfaction.     Kahle Mcqueen C.

## 2016-10-28 NOTE — Op Note (Signed)
10/28/2016  9:15 AM  PATIENT:  Jermaine [name]  70 y.o. male  Patient Care Team: Seward Carol, MD as PCP - General (Internal Medicine) Michael Boston, MD as Consulting Physician (General Surgery)  PRE-OPERATIVE DIAGNOSIS:  BILATERAL INGUINAL HERNIA  POST-OPERATIVE DIAGNOSIS:  BILATERAL INGUINAL HERNIA  PROCEDURE:  Procedure(s): LAPAROSCOPIC BILATERAL INGUINAL HERNIA REPAIR INSERTION OF MESH  SURGEON:  Surgeon(s): Michael Boston, MD  ASSISTANT: RN   ANESTHESIA:   Regional ilioinguinal and genitofemoral and spermatic cord nerve blocks with GETA  EBL:  Total I/O In: -  Out: 10 [Blood:10]  Delay start of Pharmacological VTE agent (>24hrs) due to surgical blood loss or risk of bleeding:  no  DRAINS: NONE  SPECIMEN:  NONE  DISPOSITION OF SPECIMEN:  N/A  COUNTS:  YES  PLAN OF CARE: Discharge to home after PACU  PATIENT DISPOSITION:  PACU - hemodynamically stable.  INDICATION: Patient with symptomatic right hernia.  Probable contralateral hernia as well.  I recommended laparoscopic exploration and repair  The anatomy & physiology of the abdominal wall and pelvic floor was discussed.  The pathophysiology of hernias in the inguinal and pelvic region was discussed.  Natural history risks such as progressive enlargement, pain, incarceration & strangulation was discussed.   Contributors to complications such as smoking, obesity, diabetes, prior surgery, etc were discussed.    I feel the risks of no intervention will lead to serious problems that outweigh the operative risks; therefore, I recommended surgery to reduce and repair the hernia.  I explained laparoscopic techniques with possible need for an open approach.  I noted usual use of mesh to patch and/or buttress hernia repair  Risks such as bleeding, infection, abscess, need for further treatment, heart attack, death, and other risks were discussed.  I noted a good likelihood this will help address the problem.   Goals of  post-operative recovery were discussed as well.  Possibility that this will not correct all symptoms was explained.  I stressed the importance of low-impact activity, aggressive pain control, avoiding constipation, & not pushing through pain to minimize risk of post-operative chronic pain or injury. Possibility of reherniation was discussed.  We will work to minimize complications.     An educational handout further explaining the pathology & treatment options was given as well.  Questions were answered.  The patient expresses understanding & wishes to proceed with surgery.  OR FINDINGS: Moderate size indirect hernia on the right side.  No direct inguinal, femoral, nor obturator hernias.  On the left he had a direct greater than indirect pantaloon type inguinal hernia.  Smaller Gen.  Mild femoral canal laxity no true hernia.  No obturator hernia.  DESCRIPTION:   The patient was identified & brought into the operating room. The patient was positioned supine with arms tucked. SCDs were active during the entire case. The patient underwent general anesthesia without any difficulty.  The abdomen was prepped and draped in a sterile fashion. The patient's bladder was emptied.  A Surgical Timeout confirmed our plan.  I made a transverse incision through the inferior umbilical fold.  I made a small transverse nick through the anterior rectus fascia contralateral to the inguinal hernia side and placed a 0-vicryl stitch through the fascia.  I placed a Hasson trocar into the preperitoneal plane.  Entry was clean.  We induced carbon dioxide insufflation. Camera inspection revealed no injury.  I used a 42mm angled scope to bluntly free the peritoneum off the infraumbilical anterior abdominal wall.  I created enough  of a preperitoneal pocket to place 84mm ports into the right & left mid-abdomen into this preperitoneal cavity.  I focused attention on the right side since that was the dominant hernia side.   I used  blunt & focused sharp dissection to free the peritoneum off the flank and down to the pubic rim.  I freed the anteriolateral bladder wall off the anteriolateral pelvic wall, sparing midline attachments.   I located a swath of peritoneum going into a hernia fascial defect at the internal ring consistent with an indirect inguinal hernia.  I gradually freed the peritoneal hernia sac off safely and reduced it into the preperitoneal space.  I freed the peritoneum off the spermatic vessels & vas deferens.  I freed peritoneum off the retroperitoneum along the psoas muscle.   Patient had two moderately large spermatic cord lipomas.  These were carefully removed.  Patient had moderate peritoneal inflammation with the hernia sac.  I ended up doing a high ligation and closing the peritoneal defect help close down the hernia sac better as well.  I checked & assured hemostasis.    I turned attention on the opposite side.  I did dissection in a similar, mirror-image fashion. The patient had a small but definite direct space hernia.  Some laxity at the internal ring suspicious for an indirect sliding hernia as well.  Mild laxity to femoral canal but no true hernia there.  No obturator hernia.  Her spermatic cord lipoma that was skeletonized and removed.      I chose 15x15 cm sheets of ultra-lightweight polypropylene mesh (Ultrapro), one for each side.  I cut a single sigmoid-shaped slit ~6cm from a corner of each mesh.  I placed the meshes into the preperitoneal space & laid them as overlapping diamonds such that at the inferior points, a 6x6 cm corner flap rested in the true anterolateral pelvis, covering the obturator & femoral foramina.   I allowed the bladder to return to the pubis, this helping tuck the corners of the mesh in the anteriolateral pelvis.  The medial corners overlapped each other across midline cephalad to the pubic rim.   Because he had numerous hernias including a moderate direct space hernia, placed a  third mesh as a diamond along the midline.  Lateral wings of this mesh overlapped around internal rings, covering up the hernia as well. This provided >2 inch coverage around the hernia.  Because the defects well covered and  placing a third overlapping mesh, I did not place any tacks.  I held the hernia sacs cephalad & evacuated carbon dioxide.  I closed the fascia with absorbable suture.  I closed the skin using 4-0 monocryl stitch.  Sterile dressings were applied.   The patient was extubated & arrived in the PACU in stable condition..  I had discussed postoperative care with the patient in the holding area.  Instructions are written in the chart.  I discussed operative findings, updated the patient's status, discussed probable steps to recovery, and gave postoperative recommendations to the patient's spouse.  Recommendations were made.  Questions were answered.  She expressed understanding & appreciation.   Adin Hector, M.D., F.A.C.S. Gastrointestinal and Minimally Invasive Surgery [facility] [address], [place],  [id number] [phone] Main / Paging

## 2016-10-28 NOTE — Discharge Instructions (Signed)
HERNIA REPAIR: POST OP INSTRUCTIONS ° °###################################################################### ° °EAT °Gradually transition to a high fiber diet with a fiber supplement over the next few weeks after discharge.  Start with a pureed / full liquid diet (see below) ° °WALK °Walk an hour a day.  Control your pain to do that.   ° °CONTROL PAIN °Control pain so that you can walk, sleep, tolerate sneezing/coughing, go up/down stairs. ° °HAVE A BOWEL MOVEMENT DAILY °Keep your bowels regular to avoid problems.  OK to try a laxative to override constipation.  OK to use an antidairrheal to slow down diarrhea.  Call if not better after 2 tries ° °CALL IF YOU HAVE PROBLEMS/CONCERNS °Call if you are still struggling despite following these instructions. °Call if you have concerns not answered by these instructions ° °###################################################################### ° ° ° °1. DIET: Follow a light bland diet the first 24 hours after arrival home, such as soup, liquids, crackers, etc.  Be sure to include lots of fluids daily.  Avoid fast food or heavy meals as your are more likely to get nauseated.  Eat a low fat the next few days after surgery. °2. Take your usually prescribed home medications unless otherwise directed. °3. PAIN CONTROL: °a. Pain is best controlled by a usual combination of three different methods TOGETHER: °i. Ice/Heat °ii. Over the counter pain medication °iii. Prescription pain medication °b. Most patients will experience some swelling and bruising around the hernia(s) such as the bellybutton, groins, or old incisions.  Ice packs or heating pads (30-60 minutes up to 6 times a day) will help. Use ice for the first few days to help decrease swelling and bruising, then switch to heat to help relax tight/sore spots and speed recovery.  Some people prefer to use ice alone, heat alone, alternating between ice & heat.  Experiment to what works for you.  Swelling and bruising can take  several weeks to resolve.   °c. It is helpful to take an over-the-counter pain medication regularly for the first few weeks.  Choose one of the following that works best for you: °i. Naproxen (Aleve, etc)  Two 220mg tabs twice a day °ii. Ibuprofen (Advil, etc) Three 200mg tabs four times a day (every meal & bedtime) °iii. Acetaminophen (Tylenol, etc) 325-650mg four times a day (every meal & bedtime) °d. A  prescription for pain medication should be given to you upon discharge.  Take your pain medication as prescribed.  °i. If you are having problems/concerns with the prescription medicine (does not control pain, nausea, vomiting, rash, itching, etc), please call us [phone] to see if we need to switch you to a different pain medicine that will work better for you and/or control your side effect better. °ii. If you need a refill on your pain medication, please contact your pharmacy.  They will contact our office to request authorization. Prescriptions will not be filled after 5 pm or on week-ends. °4. Avoid getting constipated.  Between the surgery and the pain medications, it is common to experience some constipation.  Increasing fluid intake and taking a fiber supplement (such as Metamucil, Citrucel, FiberCon, MiraLax, etc) 1-2 times a day regularly will usually help prevent this problem from occurring.  A mild laxative (prune juice, Milk of Magnesia, MiraLax, etc) should be taken according to package directions if there are no bowel movements after 48 hours.   °5. Wash / shower every day.  You may shower over the dressings as they are waterproof.   °6. Remove   your waterproof bandages 5 days after surgery.  You may leave the incision open to air.  You may replace a dressing/Band-Aid to cover the incision for comfort if you wish.  Continue to shower over incision(s) after the dressing is off. ° ° ° °7. ACTIVITIES as tolerated:   °a. You may resume regular (light) daily activities beginning the next day--such  as daily self-care, walking, climbing stairs--gradually increasing activities as tolerated.  If you can walk 30 minutes without difficulty, it is safe to try more intense activity such as jogging, treadmill, bicycling, low-impact aerobics, swimming, etc. °b. Save the most intensive and strenuous activity for last such as sit-ups, heavy lifting, contact sports, etc  Refrain from any heavy lifting or straining until you are off narcotics for pain control.   °c. DO NOT PUSH THROUGH PAIN.  Let pain be your guide: If it hurts to do something, don't do it.  Pain is your body warning you to avoid that activity for another week until the pain goes down. °d. You may drive when you are no longer taking prescription pain medication, you can comfortably wear a seatbelt, and you can safely maneuver your car and apply brakes. °e. You may have sexual intercourse when it is comfortable.  °8. FOLLOW UP in our office °a. Please call [facility] at [phone] to set up an appointment to see your surgeon in the office for a follow-up appointment approximately 2-3 weeks after your surgery. °b. Make sure that you call for this appointment the day you arrive home to insure a convenient appointment time. °9.  IF YOU HAVE DISABILITY OR FAMILY LEAVE FORMS, BRING THEM TO THE OFFICE FOR PROCESSING.  DO NOT GIVE THEM TO YOUR DOCTOR. ° °WHEN TO CALL US [phone]: °1. Poor pain control °2. Reactions / problems with new medications (rash/itching, nausea, etc)  °3. Fever over 101.5 F (38.5 C) °4. Inability to urinate °5. Nausea and/or vomiting °6. Worsening swelling or bruising °7. Continued bleeding from incision. °8. Increased pain, redness, or drainage from the incision ° ° The clinic staff is available to answer your questions during regular business hours (8:30am-5pm).  Please don’t hesitate to call and ask to speak to one of our nurses for clinical concerns.  ° If you have a medical emergency, go to the nearest emergency room or call  911. ° A surgeon from [facility] is always on call at the hospitals in [place] ° °[facility] °[address], [address], [place], [place]  [address] ? ° [address] [address], [place], [place]   [address] °MAIN: [phone] ? TOLL FREE: [phone] ? FAX: [phone] °[email or web address] ° ° °

## 2016-10-28 NOTE — Anesthesia Preprocedure Evaluation (Signed)
Anesthesia Evaluation  Patient identified by MRN, date of birth, ID band Patient awake    Reviewed: Allergy & Precautions, NPO status , Patient's Chart, lab work & pertinent test results  History of Anesthesia Complications Negative for: history of anesthetic complications  Airway Mallampati: III  TM Distance: >3 FB Neck ROM: Full    Dental  (+) Dental Advisory Given   Pulmonary sleep apnea and Continuous Positive Airway Pressure Ventilation ,    breath sounds clear to auscultation       Cardiovascular negative cardio ROS   Rhythm:Regular Rate:Normal     Neuro/Psych Spinal stenosis: tramadol    GI/Hepatic negative GI ROS, Neg liver ROS,   Endo/Other  negative endocrine ROS  Renal/GU negative Renal ROS     Musculoskeletal   Abdominal   Peds  Hematology negative hematology ROS (+)   Anesthesia Other Findings   Reproductive/Obstetrics                             Anesthesia Physical Anesthesia Plan  ASA: III  Anesthesia Plan: General   Post-op Pain Management:    Induction: Intravenous  Airway Management Planned: Oral ETT  Additional Equipment:   Intra-op Plan:   Post-operative Plan: Extubation in OR  Informed Consent: I have reviewed the patients History and Physical, chart, labs and discussed the procedure including the risks, benefits and alternatives for the proposed anesthesia with the patient or authorized representative who has indicated his/her understanding and acceptance.   Dental advisory given  Plan Discussed with: CRNA and Surgeon  Anesthesia Plan Comments: (Plan routine monitors, GETA)        Anesthesia Quick Evaluation

## 2016-10-28 NOTE — Transfer of Care (Signed)
Immediate Anesthesia Transfer of Care Note  Patient: Jermaine [name]  Procedure(s) Performed: Procedure(s): LAPAROSCOPIC BILATERAL INGUINAL HERNIA REPAIR (Bilateral) INSERTION OF MESH (Bilateral)  Patient Location: PACU  Anesthesia Type:General  Level of Consciousness: sedated, patient cooperative and responds to stimulation  Airway & Oxygen Therapy: Patient Spontanous Breathing and Patient connected to face mask oxygen  Post-op Assessment: Report given to RN and Post -op Vital signs reviewed and stable  Post vital signs: Reviewed and stable  Last Vitals:  Vitals:   10/28/16 0548  BP: 120/76  Pulse: 60  Resp: 18  Temp: 36.7 C    Last Pain:  Vitals:   10/28/16 0548  TempSrc: Oral      Patients Stated Pain Goal: 4 ([phone] [id number])  Complications: No apparent anesthesia complications

## 2016-10-28 NOTE — Anesthesia Postprocedure Evaluation (Signed)
Anesthesia Post Note  Patient: Jermaine [name]  Procedure(s) Performed: Procedure(s) (LRB): LAPAROSCOPIC BILATERAL INGUINAL HERNIA REPAIR (Bilateral) INSERTION OF MESH (Bilateral)  Patient location during evaluation: PACU Anesthesia Type: General Level of consciousness: awake and alert, oriented and patient cooperative Pain management: pain level controlled Vital Signs Assessment: post-procedure vital signs reviewed and stable Respiratory status: spontaneous breathing, nonlabored ventilation and respiratory function stable Cardiovascular status: blood pressure returned to baseline and stable Postop Assessment: no signs of nausea or vomiting Anesthetic complications: no    Last Vitals:  Vitals:   10/28/16 0926 10/28/16 0930  BP: 120/83 116/77  Pulse: 60 60  Resp: 15 15  Temp: 36.3 C     Last Pain:  Vitals:   10/28/16 0548  TempSrc: Oral                 Latwan Luchsinger,E. Sydnee Lamour

## 2016-10-28 NOTE — Anesthesia Procedure Notes (Signed)
Procedure Name: Intubation Performed by: Aerial Dilley J Pre-anesthesia Checklist: Patient identified, Emergency Drugs available, Suction available, Patient being monitored and Timeout performed Patient Re-evaluated:Patient Re-evaluated prior to inductionOxygen Delivery Method: Circle system utilized Preoxygenation: Pre-oxygenation with 100% oxygen Intubation Type: IV induction Ventilation: Mask ventilation without difficulty Laryngoscope Size: Mac and 4 Grade View: Grade II Tube type: Oral Tube size: 7.5 mm Number of attempts: 1 Airway Equipment and Method: Stylet Placement Confirmation: ETT inserted through vocal cords under direct vision,  positive ETCO2,  CO2 detector and breath sounds checked- equal and bilateral Secured at: 23 cm Tube secured with: Tape Dental Injury: Teeth and Oropharynx as per pre-operative assessment        

## 2017-02-20 DIAGNOSIS — G4733 Obstructive sleep apnea (adult) (pediatric): Secondary | ICD-10-CM | POA: Diagnosis not present

## 2017-02-20 DIAGNOSIS — E7800 Pure hypercholesterolemia, unspecified: Secondary | ICD-10-CM | POA: Diagnosis not present

## 2017-02-20 DIAGNOSIS — E1165 Type 2 diabetes mellitus with hyperglycemia: Secondary | ICD-10-CM | POA: Diagnosis not present

## 2017-02-28 DIAGNOSIS — E1165 Type 2 diabetes mellitus with hyperglycemia: Secondary | ICD-10-CM | POA: Diagnosis not present

## 2017-02-28 DIAGNOSIS — M21372 Foot drop, left foot: Secondary | ICD-10-CM | POA: Diagnosis not present

## 2017-06-02 DIAGNOSIS — E1165 Type 2 diabetes mellitus with hyperglycemia: Secondary | ICD-10-CM | POA: Diagnosis not present

## 2017-06-02 DIAGNOSIS — Z7984 Long term (current) use of oral hypoglycemic drugs: Secondary | ICD-10-CM | POA: Diagnosis not present

## 2017-09-09 DIAGNOSIS — G4733 Obstructive sleep apnea (adult) (pediatric): Secondary | ICD-10-CM | POA: Diagnosis not present

## 2017-09-09 DIAGNOSIS — E7800 Pure hypercholesterolemia, unspecified: Secondary | ICD-10-CM | POA: Diagnosis not present

## 2017-09-09 DIAGNOSIS — K635 Polyp of colon: Secondary | ICD-10-CM | POA: Diagnosis not present

## 2017-09-09 DIAGNOSIS — E1165 Type 2 diabetes mellitus with hyperglycemia: Secondary | ICD-10-CM | POA: Diagnosis not present

## 2017-09-09 DIAGNOSIS — Z125 Encounter for screening for malignant neoplasm of prostate: Secondary | ICD-10-CM | POA: Diagnosis not present

## 2017-09-09 DIAGNOSIS — Z1389 Encounter for screening for other disorder: Secondary | ICD-10-CM | POA: Diagnosis not present

## 2017-09-09 DIAGNOSIS — Z0000 Encounter for general adult medical examination without abnormal findings: Secondary | ICD-10-CM | POA: Diagnosis not present

## 2017-09-24 DIAGNOSIS — G4733 Obstructive sleep apnea (adult) (pediatric): Secondary | ICD-10-CM | POA: Diagnosis not present

## 2017-10-14 DIAGNOSIS — D126 Benign neoplasm of colon, unspecified: Secondary | ICD-10-CM | POA: Diagnosis not present

## 2017-10-14 DIAGNOSIS — Z86010 Personal history of colonic polyps: Secondary | ICD-10-CM | POA: Diagnosis not present

## 2017-10-16 DIAGNOSIS — D126 Benign neoplasm of colon, unspecified: Secondary | ICD-10-CM | POA: Diagnosis not present

## 2018-03-30 DIAGNOSIS — E1165 Type 2 diabetes mellitus with hyperglycemia: Secondary | ICD-10-CM | POA: Diagnosis not present

## 2018-05-05 DIAGNOSIS — M779 Enthesopathy, unspecified: Secondary | ICD-10-CM | POA: Diagnosis not present

## 2018-09-25 DIAGNOSIS — Z1389 Encounter for screening for other disorder: Secondary | ICD-10-CM | POA: Diagnosis not present

## 2018-09-25 DIAGNOSIS — E1165 Type 2 diabetes mellitus with hyperglycemia: Secondary | ICD-10-CM | POA: Diagnosis not present

## 2018-09-25 DIAGNOSIS — Z125 Encounter for screening for malignant neoplasm of prostate: Secondary | ICD-10-CM | POA: Diagnosis not present

## 2018-09-25 DIAGNOSIS — G4733 Obstructive sleep apnea (adult) (pediatric): Secondary | ICD-10-CM | POA: Diagnosis not present

## 2018-09-25 DIAGNOSIS — Z0000 Encounter for general adult medical examination without abnormal findings: Secondary | ICD-10-CM | POA: Diagnosis not present

## 2019-10-25 DIAGNOSIS — Z125 Encounter for screening for malignant neoplasm of prostate: Secondary | ICD-10-CM | POA: Diagnosis not present

## 2019-10-25 DIAGNOSIS — G4733 Obstructive sleep apnea (adult) (pediatric): Secondary | ICD-10-CM | POA: Diagnosis not present

## 2019-10-25 DIAGNOSIS — Z1389 Encounter for screening for other disorder: Secondary | ICD-10-CM | POA: Diagnosis not present

## 2019-10-25 DIAGNOSIS — E1169 Type 2 diabetes mellitus with other specified complication: Secondary | ICD-10-CM | POA: Diagnosis not present

## 2019-10-25 DIAGNOSIS — E7800 Pure hypercholesterolemia, unspecified: Secondary | ICD-10-CM | POA: Diagnosis not present

## 2019-10-25 DIAGNOSIS — Z0000 Encounter for general adult medical examination without abnormal findings: Secondary | ICD-10-CM | POA: Diagnosis not present

## 2020-10-30 DIAGNOSIS — F4321 Adjustment disorder with depressed mood: Secondary | ICD-10-CM | POA: Diagnosis not present

## 2020-10-30 DIAGNOSIS — E1169 Type 2 diabetes mellitus with other specified complication: Secondary | ICD-10-CM | POA: Diagnosis not present

## 2020-10-30 DIAGNOSIS — M48061 Spinal stenosis, lumbar region without neurogenic claudication: Secondary | ICD-10-CM | POA: Diagnosis not present

## 2020-10-30 DIAGNOSIS — M21372 Foot drop, left foot: Secondary | ICD-10-CM | POA: Diagnosis not present

## 2020-10-30 DIAGNOSIS — E7800 Pure hypercholesterolemia, unspecified: Secondary | ICD-10-CM | POA: Diagnosis not present

## 2020-10-30 DIAGNOSIS — Z125 Encounter for screening for malignant neoplasm of prostate: Secondary | ICD-10-CM | POA: Diagnosis not present

## 2020-10-30 DIAGNOSIS — Z7984 Long term (current) use of oral hypoglycemic drugs: Secondary | ICD-10-CM | POA: Diagnosis not present

## 2020-10-30 DIAGNOSIS — Z0000 Encounter for general adult medical examination without abnormal findings: Secondary | ICD-10-CM | POA: Diagnosis not present

## 2020-10-30 DIAGNOSIS — G4733 Obstructive sleep apnea (adult) (pediatric): Secondary | ICD-10-CM | POA: Diagnosis not present

## 2022-01-16 DIAGNOSIS — N529 Male erectile dysfunction, unspecified: Secondary | ICD-10-CM | POA: Diagnosis not present

## 2022-01-16 DIAGNOSIS — Z0000 Encounter for general adult medical examination without abnormal findings: Secondary | ICD-10-CM | POA: Diagnosis not present

## 2022-01-16 DIAGNOSIS — E7800 Pure hypercholesterolemia, unspecified: Secondary | ICD-10-CM | POA: Diagnosis not present

## 2022-01-16 DIAGNOSIS — E119 Type 2 diabetes mellitus without complications: Secondary | ICD-10-CM | POA: Diagnosis not present

## 2022-01-16 DIAGNOSIS — E1169 Type 2 diabetes mellitus with other specified complication: Secondary | ICD-10-CM | POA: Diagnosis not present

## 2022-01-16 DIAGNOSIS — F4321 Adjustment disorder with depressed mood: Secondary | ICD-10-CM | POA: Diagnosis not present

## 2022-01-16 DIAGNOSIS — Z125 Encounter for screening for malignant neoplasm of prostate: Secondary | ICD-10-CM | POA: Diagnosis not present

## 2022-01-16 DIAGNOSIS — Z5181 Encounter for therapeutic drug level monitoring: Secondary | ICD-10-CM | POA: Diagnosis not present

## 2022-01-16 DIAGNOSIS — M21372 Foot drop, left foot: Secondary | ICD-10-CM | POA: Diagnosis not present

## 2022-01-16 DIAGNOSIS — G4733 Obstructive sleep apnea (adult) (pediatric): Secondary | ICD-10-CM | POA: Diagnosis not present

## 2022-03-20 DIAGNOSIS — R42 Dizziness and giddiness: Secondary | ICD-10-CM | POA: Diagnosis not present

## 2022-04-15 DIAGNOSIS — H2513 Age-related nuclear cataract, bilateral: Secondary | ICD-10-CM | POA: Diagnosis not present

## 2022-04-15 DIAGNOSIS — H52203 Unspecified astigmatism, bilateral: Secondary | ICD-10-CM | POA: Diagnosis not present

## 2022-04-15 DIAGNOSIS — H5203 Hypermetropia, bilateral: Secondary | ICD-10-CM | POA: Diagnosis not present

## 2022-04-15 DIAGNOSIS — E119 Type 2 diabetes mellitus without complications: Secondary | ICD-10-CM | POA: Diagnosis not present

## 2022-04-15 DIAGNOSIS — H524 Presbyopia: Secondary | ICD-10-CM | POA: Diagnosis not present

## 2022-06-16 ENCOUNTER — Emergency Department (HOSPITAL_BASED_OUTPATIENT_CLINIC_OR_DEPARTMENT_OTHER): Payer: PPO

## 2022-06-16 ENCOUNTER — Encounter (HOSPITAL_BASED_OUTPATIENT_CLINIC_OR_DEPARTMENT_OTHER): Payer: Self-pay | Admitting: Emergency Medicine

## 2022-06-16 ENCOUNTER — Emergency Department (HOSPITAL_BASED_OUTPATIENT_CLINIC_OR_DEPARTMENT_OTHER)
Admission: EM | Admit: 2022-06-16 | Discharge: 2022-06-16 | Disposition: A | Discharge status: Home / Self Care | Payer: PPO | Attending: Emergency Medicine | Admitting: Emergency Medicine

## 2022-06-16 ENCOUNTER — Other Ambulatory Visit: Payer: Self-pay

## 2022-06-16 DIAGNOSIS — R0789 Other chest pain: Secondary | ICD-10-CM | POA: Diagnosis not present

## 2022-06-16 DIAGNOSIS — R079 Chest pain, unspecified: Secondary | ICD-10-CM | POA: Insufficient documentation

## 2022-06-16 DIAGNOSIS — R109 Unspecified abdominal pain: Secondary | ICD-10-CM | POA: Diagnosis not present

## 2022-06-16 DIAGNOSIS — R072 Precordial pain: Secondary | ICD-10-CM

## 2022-06-16 LAB — LIPASE, BLOOD: Lipase: 43 U/L (ref 11–51)

## 2022-06-16 LAB — CBC
HCT: 42.4 % (ref 39.0–52.0)
Hemoglobin: 15.1 g/dL (ref 13.0–17.0)
MCH: 32.7 pg (ref 26.0–34.0)
MCHC: 35.6 g/dL (ref 30.0–36.0)
MCV: 91.8 fL (ref 80.0–100.0)
Platelets: 169 10*3/uL (ref 150–400)
RBC: 4.62 MIL/uL (ref 4.22–5.81)
RDW: 11.9 % (ref 11.5–15.5)
WBC: 4 10*3/uL (ref 4.0–10.5)
nRBC: 0 % (ref 0.0–0.2)

## 2022-06-16 LAB — BASIC METABOLIC PANEL
Anion gap: 7 (ref 5–15)
BUN: 13 mg/dL (ref 8–23)
CO2: 26 mmol/L (ref 22–32)
Calcium: 8.6 mg/dL — ABNORMAL LOW (ref 8.9–10.3)
Chloride: 101 mmol/L (ref 98–111)
Creatinine, Ser: 1.05 mg/dL (ref 0.61–1.24)
GFR, Estimated: >60 mL/min (ref >60)
Glucose, Bld: 384 mg/dL — ABNORMAL HIGH (ref 70–99)
Potassium: 4.2 mmol/L (ref 3.5–5.1)
Sodium: 134 mmol/L — ABNORMAL LOW (ref 135–145)

## 2022-06-16 LAB — HEPATIC FUNCTION PANEL
ALT: 22 U/L (ref 0–44)
AST: 21 U/L (ref 15–41)
Albumin: 3.7 g/dL (ref 3.5–5.0)
Alkaline Phosphatase: 98 U/L (ref 38–126)
Bilirubin, Direct: 0.3 mg/dL — ABNORMAL HIGH (ref 0.0–0.2)
Indirect Bilirubin: 1.3 mg/dL — ABNORMAL HIGH (ref 0.3–0.9)
Total Bilirubin: 1.6 mg/dL — ABNORMAL HIGH (ref 0.3–1.2)
Total Protein: 7 g/dL (ref 6.5–8.1)

## 2022-06-16 LAB — URINALYSIS, ROUTINE W REFLEX MICROSCOPIC
Bilirubin Urine: NEGATIVE
Glucose, UA: >=500 mg/dL — AB
Hgb urine dipstick: NEGATIVE
Ketones, ur: NEGATIVE mg/dL
Leukocytes,Ua: NEGATIVE
Nitrite: NEGATIVE
Protein, ur: NEGATIVE mg/dL
Specific Gravity, Urine: 1.015 (ref 1.005–1.030)
pH: 6 (ref 5.0–8.0)

## 2022-06-16 LAB — TROPONIN I (HIGH SENSITIVITY)
Troponin I (High Sensitivity): 4 ng/L (ref <18)
Troponin I (High Sensitivity): 5 ng/L (ref <18)

## 2022-06-16 LAB — URINALYSIS, MICROSCOPIC (REFLEX): RBC / HPF: NONE SEEN RBC/hpf (ref 0–5)

## 2022-06-27 DIAGNOSIS — R072 Precordial pain: Secondary | ICD-10-CM | POA: Insufficient documentation

## 2022-06-27 NOTE — Progress Notes (Signed)
Cardiology Office Note   Date:  06/28/2022   ID:  Jermaine [name] 1945/12/31, MRN [id number]  PCP:  Seward Carol, MD  Cardiologist:   None Referring:  Seward Carol, MD  Chief Complaint  Patient presents with   Chest Pain      History of Present Illness: Jermaine [name] is a 76 y.o. male who presents for who was in the ED in June with chest pain.  I reviewed these records for this visit.   There was no objective evidence of ischemia.   I saw him in 2016 and he had chest pain with a negative POET (Plain Old Exercise Treadmill).     In the emergency room the enzymes were negative.  EKG demonstrated no change from previous.  They actually did a right upper quadrant ultrasound because he thought his discomfort was possibly GI.  This was negative.  He said this happened after he had a sausage biscuit biscuit bill.  He had some discomfort that was right-sided.  It lasted for about 15 minutes.  There was no associated symptoms.  He has not had this since then.  He walks routinely for exercise and does not bring on any symptoms.  He has had stress.  Daughter about 10 years before with breast cancer.  Since I saw him his granddaughter died of brain cancer.       Past Medical History:  Diagnosis Date   DM (diabetes mellitus) ([place])    Lumbosacral radiculopathy at L5    foot drop left   Sleep apnea    CPAP   Spinal stenosis     Past Surgical History:  Procedure Laterality Date   COLONOSCOPY W/ POLYPECTOMY     INGUINAL HERNIA REPAIR Bilateral 10/28/2016   Procedure: LAPAROSCOPIC BILATERAL INGUINAL HERNIA REPAIR;  Surgeon: Michael Boston, MD;  Location: [facility];  Service: General;  Laterality: Bilateral;   INSERTION OF MESH Bilateral 10/28/2016   Procedure: INSERTION OF MESH;  Surgeon: Michael Boston, MD;  Location: [facility];  Service: General;  Laterality: Bilateral;   KNEE ARTHROSCOPY Left    QUADRICEPS TENDON REPAIR Left 05/30/2015   Procedure: REPAIR LEFT QUADRICEP TENDON;  Surgeon:  Rod Can, MD;  Location: [place];  Service: Orthopedics;  Laterality: Left;     Current Outpatient Medications  Medication Sig Dispense Refill   traMADol (ULTRAM) 50 MG tablet Take 1-2 tablets (50-100 mg total) by mouth every 6 (six) hours as needed for moderate pain or severe pain. (Patient not taking: Reported on 06/28/2022) 30 tablet 0   No current facility-administered medications for this visit.    Allergies:   Aspirin    Social History:  The patient  reports that he has never smoked. He has never used smokeless tobacco. He reports current alcohol use. He reports that he does not use drugs.   Family History:  The patient's family history includes Diabetes in his father.    ROS:  Please see the history of present illness.   Otherwise, review of systems are positive for left foot drop, left thigh cramping.   All other systems are reviewed and negative.    PHYSICAL EXAM: VS:  BP 124/70   Pulse 82   Ht 6' (1.829 m)   Wt 201 lb 12.8 oz (91.5 kg)   SpO2 98%   BMI 27.37 kg/m  , BMI Body mass index is 27.37 kg/m. GENERAL:  Well appearing HEENT:  Pupils equal round and reactive, fundi not visualized, oral mucosa  unremarkable NECK:  No jugular venous distention, waveform within normal limits, carotid upstroke brisk and symmetric, no bruits, no thyromegaly LYMPHATICS:  No cervical, inguinal adenopathy LUNGS:  Clear to auscultation bilaterally BACK:  No CVA tenderness CHEST:  Unremarkable HEART:  PMI not displaced or sustained,S1 and S2 within normal limits, no S3, no S4, no clicks, no rubs, no murmurs ABD:  Flat, positive bowel sounds normal in frequency in pitch, no bruits, no rebound, no guarding, no midline pulsatile mass, no hepatomegaly, no splenomegaly EXT:  2 plus pulses throughout, no edema, no cyanosis no clubbing SKIN:  No rashes no nodules NEURO:  Cranial nerves II through XII grossly intact, motor grossly intact throughout PSYCH:  Cognitively intact, oriented to  person place and time    EKG:  EKG is ordered today. The ekg ordered today demonstrates sinus rhythm, rate 82, left axis deviation, left anterior fascicular block, RSR prime V1 and V2, no acute ST-T wave changes.   Recent Labs: 06/16/2022: ALT 22; BUN 13; Creatinine, Ser 1.05; Hemoglobin 15.1; Platelets 169; Potassium 4.2; Sodium 134    Lipid Panel No results found for: "CHOL", "TRIG", "HDL", "CHOLHDL", "VLDL", "LDLCALC", "LDLDIRECT"    Wt Readings from Last 3 Encounters:  06/28/22 201 lb 12.8 oz (91.5 kg)  06/16/22 194 lb (88 kg)  10/28/16 214 lb (97.1 kg)      Other studies Reviewed: Additional studies/ records that were reviewed today include: ED records. Review of the above records demonstrates:  Please see elsewhere in the note.     ASSESSMENT AND PLAN:  Precordial chest pain: The patient's chest discomfort was not really precordial.  It was nonanginal.  There was no objective evidence of ischemia.  Given this I do not think further cardiovascular testing is suggested.  Unfortunately he has unknown treated risk factors but he does not want to take medications.  He is working with his primary provider who has suggested medical therapy for his diabetes and dyslipidemia.  DM: A1c was most recently 7.8 but he refused metformin.  He says he dropped 20 pounds in the last few years and this should control it.  However, this A1c was earlier this year.  I have this conversation with him and again he wants to manage this with diet only.  He is going to follow-up with Dr. Delfina Redwood.  Dyslipidemia: LDL was 120.  He knows that is not at target but he does not want to take medications.   Current medicines are reviewed at length with the patient today.  The patient does not have concerns regarding medicines.  The following changes have been made:  no change  Labs/ tests ordered today include: None  Orders Placed This Encounter  Procedures   EKG 12-Lead     Disposition:   FU with me  as needed   Signed, Minus Breeding, MD  06/28/2022 2:15 PM    [facility]

## 2022-06-28 ENCOUNTER — Encounter: Payer: Self-pay | Admitting: Cardiology

## 2022-06-28 ENCOUNTER — Ambulatory Visit (INDEPENDENT_AMBULATORY_CARE_PROVIDER_SITE_OTHER): Payer: PPO | Admitting: Cardiology

## 2022-06-28 VITALS — BP 124/70 | HR 82 | Ht 72.0 in | Wt 201.8 lb

## 2022-06-28 DIAGNOSIS — R072 Precordial pain: Secondary | ICD-10-CM | POA: Diagnosis not present

## 2022-06-28 NOTE — Patient Instructions (Signed)
  Follow-Up: At [facility], you and your health needs are our priority.  As part of our continuing mission to provide you with exceptional heart care, we have created designated Provider Care Teams.  These Care Teams include your primary Cardiologist (physician) and Advanced Practice Providers (APPs -  Physician Assistants and Nurse Practitioners) who all work together to provide you with the care you need, when you need it.  We recommend signing up for the patient portal called "MyChart".  Sign up information is provided on this After Visit Summary.  MyChart is used to connect with patients for Virtual Visits (Telemedicine).  Patients are able to view lab/test results, encounter notes, upcoming appointments, etc.  Non-urgent messages can be sent to your provider as well.   To learn more about what you can do with MyChart, go to [email or web address].    Your next appointment:    As needed  Important Information About Sugar       

## 2022-07-22 DIAGNOSIS — E1169 Type 2 diabetes mellitus with other specified complication: Secondary | ICD-10-CM | POA: Diagnosis not present

## 2022-07-22 DIAGNOSIS — E7800 Pure hypercholesterolemia, unspecified: Secondary | ICD-10-CM | POA: Diagnosis not present

## 2022-07-22 DIAGNOSIS — F4321 Adjustment disorder with depressed mood: Secondary | ICD-10-CM | POA: Diagnosis not present

## 2022-07-22 DIAGNOSIS — G4733 Obstructive sleep apnea (adult) (pediatric): Secondary | ICD-10-CM | POA: Diagnosis not present

## 2022-11-20 DIAGNOSIS — D124 Benign neoplasm of descending colon: Secondary | ICD-10-CM | POA: Diagnosis not present

## 2022-11-20 DIAGNOSIS — Z86010 Personal history of colonic polyps: Secondary | ICD-10-CM | POA: Diagnosis not present

## 2022-11-20 DIAGNOSIS — Z09 Encounter for follow-up examination after completed treatment for conditions other than malignant neoplasm: Secondary | ICD-10-CM | POA: Diagnosis not present

## 2022-11-20 DIAGNOSIS — K648 Other hemorrhoids: Secondary | ICD-10-CM | POA: Diagnosis not present

## 2022-11-22 DIAGNOSIS — D124 Benign neoplasm of descending colon: Secondary | ICD-10-CM | POA: Diagnosis not present

## 2023-03-14 DIAGNOSIS — E1169 Type 2 diabetes mellitus with other specified complication: Secondary | ICD-10-CM | POA: Diagnosis not present

## 2023-03-14 DIAGNOSIS — Z0000 Encounter for general adult medical examination without abnormal findings: Secondary | ICD-10-CM | POA: Diagnosis not present

## 2023-03-14 DIAGNOSIS — N529 Male erectile dysfunction, unspecified: Secondary | ICD-10-CM | POA: Diagnosis not present

## 2023-03-14 DIAGNOSIS — E7800 Pure hypercholesterolemia, unspecified: Secondary | ICD-10-CM | POA: Diagnosis not present

## 2023-03-14 DIAGNOSIS — Z5181 Encounter for therapeutic drug level monitoring: Secondary | ICD-10-CM | POA: Diagnosis not present

## 2023-03-14 DIAGNOSIS — E1136 Type 2 diabetes mellitus with diabetic cataract: Secondary | ICD-10-CM | POA: Diagnosis not present

## 2023-03-14 DIAGNOSIS — E1165 Type 2 diabetes mellitus with hyperglycemia: Secondary | ICD-10-CM | POA: Diagnosis not present

## 2023-03-14 DIAGNOSIS — Z125 Encounter for screening for malignant neoplasm of prostate: Secondary | ICD-10-CM | POA: Diagnosis not present

## 2023-04-17 DIAGNOSIS — H524 Presbyopia: Secondary | ICD-10-CM | POA: Diagnosis not present

## 2023-04-17 DIAGNOSIS — H52203 Unspecified astigmatism, bilateral: Secondary | ICD-10-CM | POA: Diagnosis not present

## 2023-04-17 DIAGNOSIS — E119 Type 2 diabetes mellitus without complications: Secondary | ICD-10-CM | POA: Diagnosis not present

## 2023-04-17 DIAGNOSIS — H5203 Hypermetropia, bilateral: Secondary | ICD-10-CM | POA: Diagnosis not present

## 2023-04-17 DIAGNOSIS — H25813 Combined forms of age-related cataract, bilateral: Secondary | ICD-10-CM | POA: Diagnosis not present

## 2023-04-22 ENCOUNTER — Emergency Department (HOSPITAL_BASED_OUTPATIENT_CLINIC_OR_DEPARTMENT_OTHER): Payer: PPO

## 2023-04-22 ENCOUNTER — Encounter (HOSPITAL_BASED_OUTPATIENT_CLINIC_OR_DEPARTMENT_OTHER): Payer: Self-pay | Admitting: Pediatrics

## 2023-04-22 ENCOUNTER — Emergency Department (HOSPITAL_BASED_OUTPATIENT_CLINIC_OR_DEPARTMENT_OTHER)
Admission: EM | Admit: 2023-04-22 | Discharge: 2023-04-22 | Disposition: A | Discharge status: Home / Self Care | Payer: PPO | Attending: Emergency Medicine | Admitting: Emergency Medicine

## 2023-04-22 ENCOUNTER — Other Ambulatory Visit: Payer: Self-pay

## 2023-04-22 DIAGNOSIS — E119 Type 2 diabetes mellitus without complications: Secondary | ICD-10-CM | POA: Insufficient documentation

## 2023-04-22 DIAGNOSIS — R079 Chest pain, unspecified: Secondary | ICD-10-CM | POA: Diagnosis not present

## 2023-04-22 DIAGNOSIS — R0789 Other chest pain: Secondary | ICD-10-CM | POA: Insufficient documentation

## 2023-04-22 LAB — BASIC METABOLIC PANEL
Anion gap: 8 (ref 5–15)
BUN: 18 mg/dL (ref 8–23)
CO2: 29 mmol/L (ref 22–32)
Calcium: 8.9 mg/dL (ref 8.9–10.3)
Chloride: 100 mmol/L (ref 98–111)
Creatinine, Ser: 0.98 mg/dL (ref 0.61–1.24)
GFR, Estimated: >60 mL/min (ref >60)
Glucose, Bld: 127 mg/dL — ABNORMAL HIGH (ref 70–99)
Potassium: 3.8 mmol/L (ref 3.5–5.1)
Sodium: 137 mmol/L (ref 135–145)

## 2023-04-22 LAB — CBC
HCT: 44.2 % (ref 39.0–52.0)
Hemoglobin: 15.6 g/dL (ref 13.0–17.0)
MCH: 33.1 pg (ref 26.0–34.0)
MCHC: 35.3 g/dL (ref 30.0–36.0)
MCV: 93.8 fL (ref 80.0–100.0)
Platelets: 140 10*3/uL — ABNORMAL LOW (ref 150–400)
RBC: 4.71 MIL/uL (ref 4.22–5.81)
RDW: 12.5 % (ref 11.5–15.5)
WBC: 3.4 10*3/uL — ABNORMAL LOW (ref 4.0–10.5)
nRBC: 0 % (ref 0.0–0.2)

## 2023-04-22 LAB — TROPONIN I (HIGH SENSITIVITY)
Troponin I (High Sensitivity): 7 ng/L (ref <18)
Troponin I (High Sensitivity): 8 ng/L (ref <18)

## 2023-04-22 NOTE — ED Provider Notes (Signed)
[place] [facility]ERGENCY DEPARTMENT AT [facility] Provider Note   CSN: [id number] Arrival date & time: 04/22/23  1621     History  Chief Complaint  Patient presents with   Chest Pain    Jermaine [name] is a 77 y.o. male.  The history is provided by the patient and medical records. No language interpreter was used.  Chest Pain Pain location:  L chest and substernal area Pain quality: aching and dull   Pain radiates to:  Does not radiate Pain severity:  Moderate Onset quality:  Gradual Duration:  2 hours Timing:  Constant Progression:  Partially resolved Chronicity:  New Context: stress   Context: not trauma   Relieved by:  Nothing Worsened by:  Nothing Ineffective treatments:  None tried Associated symptoms: no abdominal pain, no anxiety, no back pain, no cough, no diaphoresis, no dizziness, no fatigue, no fever, no headache, no lower extremity edema, no nausea, no near-syncope, no numbness, no palpitations, no shortness of breath, no syncope and no vomiting        Home Medications Prior to Admission medications   Medication Sig Start Date End Date Taking? Authorizing Provider  traMADol (ULTRAM) 50 MG tablet Take 1-2 tablets (50-100 mg total) by mouth every 6 (six) hours as needed for moderate pain or severe pain. Patient not taking: Reported on 06/28/2022 10/28/16   Karie Soda, MD      Allergies    Aspirin    Review of Systems   Review of Systems  Constitutional:  Negative for diaphoresis, fatigue and fever.  HENT:  Negative for congestion.   Respiratory:  Negative for cough, chest tightness, shortness of breath and wheezing.   Cardiovascular:  Positive for chest pain. Negative for palpitations, syncope and near-syncope.  Gastrointestinal:  Negative for abdominal pain, constipation, diarrhea, nausea and vomiting.  Genitourinary:  Negative for flank pain.  Musculoskeletal:  Negative for back pain, neck pain and neck stiffness.  Skin:  Negative for rash  and wound.  Neurological:  Negative for dizziness, light-headedness, numbness and headaches.  Psychiatric/Behavioral:  Negative for agitation.   All other systems reviewed and are negative.   Physical Exam Updated Vital Signs BP 122/68 (BP Location: Left Arm)   Pulse 73   Temp 97.9 F (36.6 C)   Resp 20   Ht 5\' 11"  (1.803 m)   Wt 87.1 kg   SpO2 100%   BMI 26.78 kg/m  Physical Exam Vitals and nursing note reviewed.  Constitutional:      General: He is not in acute distress.    Appearance: He is well-developed. He is not ill-appearing, toxic-appearing or diaphoretic.  HENT:     Head: Normocephalic and atraumatic.  Eyes:     Conjunctiva/sclera: Conjunctivae normal.  Cardiovascular:     Rate and Rhythm: Normal rate and regular rhythm.     Heart sounds: No murmur heard. Pulmonary:     Effort: Pulmonary effort is normal. No respiratory distress.     Breath sounds: Normal breath sounds. No wheezing, rhonchi or rales.  Chest:     Chest wall: No tenderness.  Abdominal:     Palpations: Abdomen is soft.     Tenderness: There is no abdominal tenderness.  Musculoskeletal:        General: No swelling.     Cervical back: Neck supple.     Right lower leg: No edema.     Left lower leg: No edema.  Skin:    General: Skin is warm and dry.  Capillary Refill: Capillary refill takes less than 2 seconds.     Findings: No erythema.  Neurological:     General: No focal deficit present.     Mental Status: He is alert.  Psychiatric:        Mood and Affect: Mood normal.     ED Results / Procedures / Treatments   Labs (all labs ordered are listed, but only abnormal results are displayed) Labs Reviewed  BASIC METABOLIC PANEL - Abnormal; Notable for the following components:      Result Value   Glucose, Bld 127 (*)    All other components within normal limits  CBC - Abnormal; Notable for the following components:   WBC 3.4 (*)    Platelets 140 (*)    All other components within  normal limits  TROPONIN I (HIGH SENSITIVITY)  TROPONIN I (HIGH SENSITIVITY)    EKG EKG Interpretation  Date/Time:  Tuesday April 22 2023 16:28:04 EDT Ventricular Rate:  78 PR Interval:  146 QRS Duration: 114 QT Interval:  390 QTC Calculation: 445 R Axis:   -69 Text Interpretation: Sinus rhythm Left anterior fascicular block Abnormal R-wave progression, late transition Minimal ST elevation, anterior leads when comapred top rior, overall similar appearance. No STEMI Confirmed by Theda Belfast ([id number]) on 04/22/2023 4:30:20 PM  Radiology DG Chest 2 View  Result Date: 04/22/2023 CLINICAL DATA:  Chest pain. EXAM: CHEST - 2 VIEW COMPARISON:  June 16, 2022. FINDINGS: The heart size and mediastinal contours are within normal limits. Both lungs are clear. The visualized skeletal structures are unremarkable. IMPRESSION: No active cardiopulmonary disease. Electronically Signed   By: Lupita Raider M.D.   On: 04/22/2023 16:42    Procedures Procedures    Medications Ordered in ED Medications - No data to display  ED Course/ Medical Decision Making/ A&P                             Medical Decision Making Amount and/or Complexity of Data Reviewed Labs: ordered. Radiology: ordered.    Jermaine [name] is a 77 y.o. male with a past medical history significant for diabetes who presents with chest discomfort.  According to patient, this afternoon he started having left-sided chest pressure that was mild to moderate.  He reports no associated nausea, vomiting, lightheadedness, shortness of breath or diaphoresis.  He reports he was having discussion with his daughter and was getting stressed.  He reports it was not exertional or pleuritic.  He does report it happened after he was cutting some grass.  He denies any leg pain or leg swelling and denies trauma.  Denies rashes to suggest shingles.  He denies other complaints and thinks is either gas or stress.  On exam, lungs clear and chest nontender.   I cannot reproduce his discomfort.  No murmur.  Abdomen nontender.  Back and flanks nontender.  Good pulses in extremities.  Patient resting comfortably with minimal discomfort at this time.  EKG shows no STEMI.  Clinically have low suspicion patient is having a cardiac cause of symptoms however due to the location of discomfort we will get delta troponin, chest x-ray, and labs.  Patient agrees with workup.  Anticipate shared decision-making conversation after workup was completed to determine disposition.   8:43 PM Delta troponin negative.  Rest of workup overall reassuring.  Patient is feeling better and has no further chest discomfort.  He thinks it is all stress and would like  to go home now.  Patient understands return precautions and follow-up instructions and was discharged in good condition with resolution of symptoms.         Final Clinical Impression(s) / ED Diagnoses Final diagnoses:  Atypical chest pain    Rx / DC Orders ED Discharge Orders     None       Clinical Impression: 1. Atypical chest pain     Disposition: Discharge  Condition: Good  I have discussed the results, Dx and Tx plan with the pt(& family if present). He/she/they expressed understanding and agree(s) with the plan. Discharge instructions discussed at great length. Strict return precautions discussed and pt &/or family have verbalized understanding of the instructions. No further questions at time of discharge.    New Prescriptions   No medications on file    Follow Up: Renford Dills, MD [address] [place] [address] [phone]     [facility] Emergency Department at [facility] [address] [id number] [employer] [address] [phone]        Raylynne Cubbage, Canary Brim, MD 04/22/23 2045

## 2023-04-22 NOTE — ED Triage Notes (Signed)
C/O constant chest pain started while mowing the grass.

## 2023-04-22 NOTE — Discharge Instructions (Signed)
Your history, exam, workup today was overall reassuring.  I do suspect your symptoms are related to stress as you discussed.  Your symptoms have resolved and your workup was reassuring here.  We feel you are safe for discharge home with close PCP and cardiology follow-up.  If any symptoms change or worsen acutely, please return to the nearest emergency department.  Rest and stay hydrated.

## 2023-04-22 NOTE — ED Notes (Signed)
ED Provider at bedside. 

## 2023-05-12 ENCOUNTER — Other Ambulatory Visit: Payer: Self-pay | Admitting: Internal Medicine

## 2023-05-12 DIAGNOSIS — R2230 Localized swelling, mass and lump, unspecified upper limb: Secondary | ICD-10-CM

## 2023-05-25 ENCOUNTER — Ambulatory Visit
Admission: RE | Admit: 2023-05-25 | Discharge: 2023-05-25 | Disposition: A | Discharge status: Home / Self Care | Payer: PPO | Source: Ambulatory Visit | Attending: Internal Medicine | Admitting: Internal Medicine

## 2023-05-25 DIAGNOSIS — M19011 Primary osteoarthritis, right shoulder: Secondary | ICD-10-CM | POA: Diagnosis not present

## 2023-05-25 DIAGNOSIS — R2230 Localized swelling, mass and lump, unspecified upper limb: Secondary | ICD-10-CM

## 2023-05-25 DIAGNOSIS — M66211 Spontaneous rupture of extensor tendons, right shoulder: Secondary | ICD-10-CM | POA: Diagnosis not present

## 2023-06-04 ENCOUNTER — Ambulatory Visit (INDEPENDENT_AMBULATORY_CARE_PROVIDER_SITE_OTHER): Payer: PPO | Admitting: Orthopedic Surgery

## 2023-06-04 ENCOUNTER — Encounter: Payer: Self-pay | Admitting: Orthopedic Surgery

## 2023-06-04 VITALS — Ht 72.0 in | Wt 201.0 lb

## 2023-06-04 DIAGNOSIS — M19011 Primary osteoarthritis, right shoulder: Secondary | ICD-10-CM | POA: Diagnosis not present

## 2023-06-07 ENCOUNTER — Encounter: Payer: Self-pay | Admitting: Orthopedic Surgery

## 2023-06-07 NOTE — Progress Notes (Signed)
-  Office Visit Note   Patient: Jermaine [name]           Date of Birth: 1946-10-02           MRN: [id number] Visit Date: 06/04/2023 Requested by: Renford Dills, MD [address] [place],  [place] [address] PCP: Renford Dills, MD  Subjective: Chief Complaint  Patient presents with   Right Shoulder - Pain    HPI: CZAR TANGO is a 77 y.o. male who presents to the office reporting nonpainful right shoulder mass.  He describes a cystlike structure on the superior aspect of his shoulder which has been there for a year.  Size waxes and wanes.  He is able to sleep on the right-hand side.  He is functional with the shoulder.  Does not take anything for this issue..                ROS: All systems reviewed are negative as they relate to the chief complaint within the history of present illness.  Patient denies fevers or chills.  Assessment & Plan: Visit Diagnoses:  1. Arthritis of right acromioclavicular joint     Plan: Impression is asymptomatic moderate-sized AC joint cyst.  MRI scan is reviewed with the patient.  He also has a tear of the supraspinatus tendon with significant arthritis of the [facility] joint but he is not really that dysfunctional from that rotator cuff tear which is not repairable.  Plan at this time is observation.  We talked about aspirating the cyst if it becomes symptomatic.  No indication for surgical intervention.  Follow-up as needed.  Follow-Up Instructions: No follow-ups on file.   Orders:  No orders of the defined types were placed in this encounter.  No orders of the defined types were placed in this encounter.     Procedures: No procedures performed   Clinical Data: No additional findings.  Objective: Vital Signs: Ht 6' (1.829 m)   Wt 201 lb (91.2 kg)   BMI 27.26 kg/m   Physical Exam:  Constitutional: Patient appears well-developed HEENT:  Head: Normocephalic Eyes:EOM are normal Neck: Normal range of motion Cardiovascular: Normal  rate Pulmonary/chest: Effort normal Neurologic: Patient is alert Skin: Skin is warm Psychiatric: Patient has normal mood and affect  Ortho Exam: Ortho exam demonstrates mobile nontender cystic structure on the superior aspect of the shoulder centered over the [facility] joint.  Measures about 3 x 3 cm with about 1-1/2 to 2 cm of depth.  Has a little bit of coarseness with passive range of motion of that right shoulder but reasonable rotator cuff strength infraspinatus supraspinatus and subscap muscle testing.  Does have forward flexion and abduction above 90 degrees.  Motor or sensory function to the hand is intact.  No lymphadenopathy around that right shoulder girdle region.  Specialty Comments:  No specialty comments available.  Imaging: No results found.   PMFS History: Patient Active Problem List   Diagnosis Date Noted   Precordial chest pain 06/27/2022   Bilateral inguinal herniae s/p lap repair with mesh 10/28/2016 10/28/2016   Traumatic rupture of left quadriceps tendon 05/30/2015   Quadriceps tendon rupture 05/30/2015   Dyspnea 06/10/2014   Past Medical History:  Diagnosis Date   DM (diabetes mellitus) (HCC)    Lumbosacral radiculopathy at L5    foot drop left   Sleep apnea    CPAP   Spinal stenosis     Family History  Problem Relation Age of Onset  Diabetes Father     Past Surgical History:  Procedure Laterality Date   COLONOSCOPY W/ POLYPECTOMY     INGUINAL HERNIA REPAIR Bilateral 10/28/2016   Procedure: LAPAROSCOPIC BILATERAL INGUINAL HERNIA REPAIR;  Surgeon: Karie Soda, MD;  Location: [facility];  Service: General;  Laterality: Bilateral;   INSERTION OF MESH Bilateral 10/28/2016   Procedure: INSERTION OF MESH;  Surgeon: Karie Soda, MD;  Location: [facility];  Service: General;  Laterality: Bilateral;   KNEE ARTHROSCOPY Left    QUADRICEPS TENDON REPAIR Left 05/30/2015   Procedure: REPAIR LEFT QUADRICEP TENDON;  Surgeon: Samson Frederic, MD;  Location: [facility] OR;  Service:  Orthopedics;  Laterality: Left;   Social History   Occupational History   Not on file  Tobacco Use   Smoking status: Never   Smokeless tobacco: Never  Substance and Sexual Activity   Alcohol use: Yes    Comment: occ   Drug use: No   Sexual activity: Not on file

## 2023-06-23 ENCOUNTER — Ambulatory Visit: Payer: PPO | Admitting: Orthopedic Surgery

## 2023-06-23 DIAGNOSIS — M19011 Primary osteoarthritis, right shoulder: Secondary | ICD-10-CM | POA: Diagnosis not present

## 2023-06-24 ENCOUNTER — Encounter: Payer: Self-pay | Admitting: Orthopedic Surgery

## 2023-06-24 NOTE — Progress Notes (Unsigned)
Office Visit Note   Patient: Jermaine [name]           Date of Birth: [birth year]           MRN: [id number] Visit Date: 06/23/2023 Requested by: Renford Dills, MD [address] [place],  [place] [address] PCP: Renford Dills, MD  Subjective: Chief Complaint  Patient presents with  . Other     Shoulder pain    HPI: Jermaine [name] is a 77 y.o. male who presents to the office reporting ***.                ROS: All systems reviewed are negative as they relate to the chief complaint within the history of present illness.  Patient denies fevers or chills.  Assessment & Plan: Visit Diagnoses:  1. Arthritis of right acromioclavicular joint     Plan: ***  Follow-Up Instructions: No follow-ups on file.   Orders:  No orders of the defined types were placed in this encounter.  No orders of the defined types were placed in this encounter.     Procedures: Medium Joint Inj: R acromioclavicular on 06/23/2023 10:00 PM Indications: diagnostic evaluation and pain Details: 25 G 1.5 in needle, ultrasound-guided superior approach Medications: 3 mL lidocaine 1 %; 0.66 mL bupivacaine 0.25 %; 13.33 mg methylPREDNISolone acetate 40 MG/ML Outcome: tolerated well, no immediate complications  Ac joint also aspirated Procedure, treatment alternatives, risks and benefits explained, specific risks discussed. Consent was given by the patient. Immediately prior to procedure a time out was called to verify the correct patient, procedure, equipment, support staff and site/side marked as required. Patient was prepped and draped in the usual sterile fashion.     Clinical Data: No additional findings.  Objective: Vital Signs: There were no vitals taken for this visit.  Physical Exam:  Constitutional: Patient appears well-developed HEENT:  Head: Normocephalic Eyes:EOM are normal Neck: Normal range of motion Cardiovascular: Normal rate Pulmonary/chest: Effort normal Neurologic:  Patient is alert Skin: Skin is warm Psychiatric: Patient has normal mood and affect  Ortho Exam: ***  Specialty Comments:  No specialty comments available.  Imaging: No results found.   PMFS History: Patient Active Problem List   Diagnosis Date Noted  . Precordial chest pain 06/27/2022  . Bilateral inguinal herniae s/p lap repair with mesh 10/28/2016 10/28/2016  . Traumatic rupture of left quadriceps tendon 05/30/2015  . Quadriceps tendon rupture 05/30/2015  . Dyspnea 06/10/2014   Past Medical History:  Diagnosis Date  . DM (diabetes mellitus) (HCC)   . Lumbosacral radiculopathy at L5    foot drop left  . Sleep apnea    CPAP  . Spinal stenosis     Family History  Problem Relation Age of Onset  . Diabetes Father     Past Surgical History:  Procedure Laterality Date  . COLONOSCOPY W/ POLYPECTOMY    . INGUINAL HERNIA REPAIR Bilateral 10/28/2016   Procedure: LAPAROSCOPIC BILATERAL INGUINAL HERNIA REPAIR;  Surgeon: Karie Soda, MD;  Location: [facility];  Service: General;  Laterality: Bilateral;  . INSERTION OF MESH Bilateral 10/28/2016   Procedure: INSERTION OF MESH;  Surgeon: Karie Soda, MD;  Location: [facility];  Service: General;  Laterality: Bilateral;  . KNEE ARTHROSCOPY Left   . QUADRICEPS TENDON REPAIR Left 05/30/2015   Procedure: REPAIR LEFT QUADRICEP TENDON;  Surgeon: Samson Frederic, MD;  Location: [facility];  Service: Orthopedics;  Laterality: Left;   Social History   Occupational History  . Not on  file  Tobacco Use  . Smoking status: Never  . Smokeless tobacco: Never  Substance and Sexual Activity  . Alcohol use: Yes    Comment: occ  . Drug use: No  . Sexual activity: Not on file

## 2023-06-25 MED ORDER — BUPIVACAINE HCL 0.25 % IJ SOLN
0.6600 mL | INTRAMUSCULAR | Status: AC | PRN
Start: 2023-06-23 — End: 2023-06-23
  Administered 2023-06-23: .66 mL via INTRA_ARTICULAR

## 2023-06-25 MED ORDER — LIDOCAINE HCL 1 % IJ SOLN
3.0000 mL | INTRAMUSCULAR | Status: AC | PRN
Start: 2023-06-23 — End: 2023-06-23
  Administered 2023-06-23: 3 mL

## 2023-06-25 MED ORDER — METHYLPREDNISOLONE ACETATE 40 MG/ML IJ SUSP
13.3300 mg | INTRAMUSCULAR | Status: AC | PRN
Start: 2023-06-23 — End: 2023-06-23
  Administered 2023-06-23: 13.33 mg via INTRA_ARTICULAR

## 2023-09-24 DIAGNOSIS — N529 Male erectile dysfunction, unspecified: Secondary | ICD-10-CM | POA: Diagnosis not present

## 2023-09-24 DIAGNOSIS — E1136 Type 2 diabetes mellitus with diabetic cataract: Secondary | ICD-10-CM | POA: Diagnosis not present

## 2023-09-24 DIAGNOSIS — G4733 Obstructive sleep apnea (adult) (pediatric): Secondary | ICD-10-CM | POA: Diagnosis not present

## 2023-09-24 DIAGNOSIS — E7800 Pure hypercholesterolemia, unspecified: Secondary | ICD-10-CM | POA: Diagnosis not present

## 2024-04-19 ENCOUNTER — Emergency Department (HOSPITAL_COMMUNITY)
Admission: EM | Admit: 2024-04-19 | Discharge: 2024-04-19 | Disposition: A | Discharge status: Home / Self Care | Attending: Emergency Medicine | Admitting: Emergency Medicine

## 2024-04-19 ENCOUNTER — Emergency Department (HOSPITAL_COMMUNITY)

## 2024-04-19 ENCOUNTER — Other Ambulatory Visit: Payer: Self-pay

## 2024-04-19 ENCOUNTER — Encounter (HOSPITAL_COMMUNITY): Payer: Self-pay | Admitting: Emergency Medicine

## 2024-04-19 DIAGNOSIS — R001 Bradycardia, unspecified: Secondary | ICD-10-CM | POA: Diagnosis not present

## 2024-04-19 DIAGNOSIS — R109 Unspecified abdominal pain: Secondary | ICD-10-CM | POA: Insufficient documentation

## 2024-04-19 DIAGNOSIS — R1111 Vomiting without nausea: Secondary | ICD-10-CM | POA: Diagnosis not present

## 2024-04-19 DIAGNOSIS — E119 Type 2 diabetes mellitus without complications: Secondary | ICD-10-CM | POA: Insufficient documentation

## 2024-04-19 DIAGNOSIS — R110 Nausea: Secondary | ICD-10-CM | POA: Diagnosis not present

## 2024-04-19 DIAGNOSIS — R531 Weakness: Secondary | ICD-10-CM | POA: Diagnosis not present

## 2024-04-19 DIAGNOSIS — R112 Nausea with vomiting, unspecified: Secondary | ICD-10-CM | POA: Diagnosis not present

## 2024-04-19 DIAGNOSIS — R61 Generalized hyperhidrosis: Secondary | ICD-10-CM | POA: Diagnosis not present

## 2024-04-19 DIAGNOSIS — R42 Dizziness and giddiness: Secondary | ICD-10-CM | POA: Diagnosis not present

## 2024-04-19 DIAGNOSIS — R739 Hyperglycemia, unspecified: Secondary | ICD-10-CM | POA: Diagnosis not present

## 2024-04-19 LAB — LIPASE, BLOOD: Lipase: 57 U/L — ABNORMAL HIGH (ref 11–51)

## 2024-04-19 LAB — COMPREHENSIVE METABOLIC PANEL WITH GFR
ALT: 24 U/L (ref 0–44)
AST: 27 U/L (ref 15–41)
Albumin: 3.9 g/dL (ref 3.5–5.0)
Alkaline Phosphatase: 77 U/L (ref 38–126)
Anion gap: 12 (ref 5–15)
BUN: 14 mg/dL (ref 8–23)
CO2: 21 mmol/L — ABNORMAL LOW (ref 22–32)
Calcium: 8.7 mg/dL — ABNORMAL LOW (ref 8.9–10.3)
Chloride: 104 mmol/L (ref 98–111)
Creatinine, Ser: 0.97 mg/dL (ref 0.61–1.24)
GFR, Estimated: >60 mL/min (ref >60)
Glucose, Bld: 286 mg/dL — ABNORMAL HIGH (ref 70–99)
Potassium: 3.4 mmol/L — ABNORMAL LOW (ref 3.5–5.1)
Sodium: 137 mmol/L (ref 135–145)
Total Bilirubin: 1.5 mg/dL — ABNORMAL HIGH (ref 0.0–1.2)
Total Protein: 6.7 g/dL (ref 6.5–8.1)

## 2024-04-19 LAB — CBC
HCT: 43.7 % (ref 39.0–52.0)
Hemoglobin: 15.4 g/dL (ref 13.0–17.0)
MCH: 32.8 pg (ref 26.0–34.0)
MCHC: 35.2 g/dL (ref 30.0–36.0)
MCV: 93 fL (ref 80.0–100.0)
Platelets: 115 10*3/uL — ABNORMAL LOW (ref 150–400)
RBC: 4.7 MIL/uL (ref 4.22–5.81)
RDW: 12.4 % (ref 11.5–15.5)
WBC: 4.1 10*3/uL (ref 4.0–10.5)
nRBC: 0 % (ref 0.0–0.2)

## 2024-04-19 LAB — TROPONIN I (HIGH SENSITIVITY): Troponin I (High Sensitivity): 8 ng/L (ref <18)

## 2024-04-19 NOTE — ED Provider Notes (Signed)
 [place] EMERGENCY DEPARTMENT AT [facility] Provider Note   CSN: [id number] Arrival date & time: 04/19/24  1548     History  Chief Complaint  Patient presents with   Abdominal Pain    Jermaine [name] is a 78 y.o. male, history of diabetes, who presents to the ED secondary to nausea, that came on about 3:30 PM today.  He states he was to [employer] her shift, in the shade, when all of a sudden he started feeling nauseated.  He states he did not have any vomiting, or abdominal pain, but felt a little bit fuzzy.  Did not have weakness on one side of the body, chest pain, or shortness of breath.  He states he feels a little bit sick to his stomach still, but overall feels better.  Has eaten today, but also states that he has been under more stress recently.    Home Medications Prior to Admission medications   Medication Sig Start Date End Date Taking? Authorizing Provider  [facility] VERIO test strip 1 each daily. 05/02/23   [provider]  traMADol  (ULTRAM ) 50 MG tablet Take 1-2 tablets (50-100 mg total) by mouth every 6 (six) hours as needed for moderate pain or severe pain. Patient not taking: Reported on 06/28/2022 10/28/16   Candyce Champagne, MD      Allergies    Aspirin     Review of Systems   Review of Systems  Respiratory:  Negative for shortness of breath.   Gastrointestinal:  Positive for nausea. Negative for abdominal pain.    Physical Exam Updated Vital Signs BP 132/68 (BP Location: Right Arm)   Pulse (!) 58   Temp (!) 95.5 F (35.3 C) (Temporal) Comment: oral temp would not read Comment (Src): RN notified  Resp 16   SpO2 97%  Physical Exam Vitals and nursing note reviewed.  Constitutional:      General: He is not in acute distress.    Appearance: He is well-developed.  HENT:     Head: Normocephalic and atraumatic.  Eyes:     Conjunctiva/sclera: Conjunctivae normal.  Cardiovascular:     Rate and Rhythm: Normal rate and regular rhythm.     Heart  sounds: No murmur heard. Pulmonary:     Effort: Pulmonary effort is normal. No respiratory distress.     Breath sounds: Normal breath sounds.  Abdominal:     Palpations: Abdomen is soft.     Tenderness: There is no abdominal tenderness.  Musculoskeletal:        General: No swelling.     Cervical back: Neck supple.  Skin:    General: Skin is warm and dry.     Capillary Refill: Capillary refill takes less than 2 seconds.  Neurological:     Mental Status: He is alert.  Psychiatric:        Mood and Affect: Mood normal.     ED Results / Procedures / Treatments   Labs (all labs ordered are listed, but only abnormal results are displayed) Labs Reviewed  LIPASE, BLOOD - Abnormal; Notable for the following components:      Result Value   Lipase 57 (*)    All other components within normal limits  COMPREHENSIVE METABOLIC PANEL WITH GFR - Abnormal; Notable for the following components:   Potassium 3.4 (*)    CO2 21 (*)    Glucose, Bld 286 (*)    Calcium 8.7 (*)    Total Bilirubin 1.5 (*)    All other  components within normal limits  CBC - Abnormal; Notable for the following components:   Platelets 115 (*)    All other components within normal limits  URINALYSIS, ROUTINE W REFLEX MICROSCOPIC  TROPONIN I (HIGH SENSITIVITY)  TROPONIN I (HIGH SENSITIVITY)    EKG EKG Interpretation Date/Time:  Monday April 19 2024 16:35:30 EDT Ventricular Rate:  57 PR Interval:  156 QRS Duration:  120 QT Interval:  468 QTC Calculation: 455 R Axis:   -62  Text Interpretation: Sinus bradycardia Left anterior fascicular block Left ventricular hypertrophy with QRS widening ( R in aVL , Cornell product ) Abnormal ECG When compared with ECG of 22-Apr-2023 16:28, PREVIOUS ECG IS PRESENT Confirmed by Afton Horse [phone]) on 04/19/2024 5:59:28 PM  Radiology DG Chest 2 View Result Date: 04/19/2024 CLINICAL DATA:  Nausea and vomiting.  Diaphoresis.  Dizziness. EXAM: CHEST - 2 VIEW COMPARISON:   04/22/2023 FINDINGS: The heart size and mediastinal contours are within normal limits. Both lungs are clear. The visualized skeletal structures are unremarkable. IMPRESSION: No active cardiopulmonary disease. Electronically Signed   By: Marlyce Sine M.D.   On: 04/19/2024 17:07    Procedures Procedures    Medications Ordered in ED Medications - No data to display  ED Course/ Medical Decision Making/ A&P                                 Medical Decision Making Patient is a well-appearing 78 year old male, history of diabetes, here for nausea, that happened around 3:00/330 today, we will obtain labs, for further evaluation.  He has no chest pain, or abdominal pain, his abdominal exam is fairly benign, will obtain troponin, given possible atypical presentation of ACS  Amount and/or Complexity of Data Reviewed Labs: ordered.    Details: Blood work unremarkable, troponin within normal limits Radiology: ordered.    Details: Chest x-ray clear Discussion of management or test interpretation with external provider(s): Patient's blood work is overall reassuring, he does have sinus bradycardia, but symptoms resolved, without any intervention, this may have been secondary to the heat.  His troponin is within normal limits, and the rest of his blood work is fairly unremarkable.  He states he is feeling much better able to tolerate p.o. intake, discharged home with strict return precautions.    Final Clinical Impression(s) / ED Diagnoses Final diagnoses:  Nausea    Rx / DC Orders ED Discharge Orders     None         Timmy Forbes, PA 04/19/24 1804    Tegeler, Marine Sia, MD 04/20/24 1500

## 2024-04-19 NOTE — Discharge Instructions (Addendum)
 Your blood work is reassuring today.  The cause of your nausea is unclear, it may have been secondary to stress, or not eating enough.  Please follow-up with your primary care doctor, as needed, return if symptoms worsen

## 2024-04-19 NOTE — ED Triage Notes (Signed)
 Pt BIB [facility] [place].  Sudden onset of dizziness, NV.  Diaphoretic on scene.  He was standing in shade.  No recent changes to medical hx or meds.  4mg  zofran  given  140/72 Hr 48-50 98% RA, CBG 279.  12 lead unremarkable.

## 2024-04-19 NOTE — ED Provider Triage Note (Signed)
 Emergency Medicine Provider Triage Evaluation Note  Jermaine [name] , a 78 y.o. male  was evaluated in triage.  Pt complains of N/V starting around 3:30 PM today. Was in shade at car dealership, when all of sudden N/V occurred. Denies abdominal pain, chest pain, SOB.  Review of Systems  Positive: N/V Negative: Abdominal pain  Physical Exam  BP 132/68 (BP Location: Right Arm)   Pulse (!) 58   Temp (!) 95.5 F (35.3 C) (Temporal) Comment: oral temp would not read Comment (Src): RN notified  Resp 16   SpO2 97%  Gen:   Awake, no distress   Resp:  Normal effort  MSK:   Moves extremities without difficulty  Other:  No ttp of abdomen  Medical Decision Making  Medically screening exam initiated at 4:29 PM.  Appropriate orders placed.  Jermaine [name] was informed that the remainder of the evaluation will be completed by another provider, this initial triage assessment does not replace that evaluation, and the importance of remaining in the ED until their evaluation is complete.     Timmy Forbes, [place] 04/19/24 1630

## 2024-04-29 DIAGNOSIS — R112 Nausea with vomiting, unspecified: Secondary | ICD-10-CM | POA: Diagnosis not present

## 2024-04-29 DIAGNOSIS — E1136 Type 2 diabetes mellitus with diabetic cataract: Secondary | ICD-10-CM | POA: Diagnosis not present

## 2024-04-29 DIAGNOSIS — E7800 Pure hypercholesterolemia, unspecified: Secondary | ICD-10-CM | POA: Diagnosis not present

## 2024-04-29 DIAGNOSIS — R42 Dizziness and giddiness: Secondary | ICD-10-CM | POA: Diagnosis not present

## 2024-05-05 DIAGNOSIS — M21372 Foot drop, left foot: Secondary | ICD-10-CM | POA: Diagnosis not present

## 2024-05-05 DIAGNOSIS — Z23 Encounter for immunization: Secondary | ICD-10-CM | POA: Diagnosis not present

## 2024-05-05 DIAGNOSIS — Z0000 Encounter for general adult medical examination without abnormal findings: Secondary | ICD-10-CM | POA: Diagnosis not present

## 2024-05-05 DIAGNOSIS — Z125 Encounter for screening for malignant neoplasm of prostate: Secondary | ICD-10-CM | POA: Diagnosis not present

## 2024-05-05 DIAGNOSIS — E7800 Pure hypercholesterolemia, unspecified: Secondary | ICD-10-CM | POA: Diagnosis not present

## 2024-05-05 DIAGNOSIS — M75102 Unspecified rotator cuff tear or rupture of left shoulder, not specified as traumatic: Secondary | ICD-10-CM | POA: Diagnosis not present

## 2024-05-05 DIAGNOSIS — E1136 Type 2 diabetes mellitus with diabetic cataract: Secondary | ICD-10-CM | POA: Diagnosis not present

## 2024-05-19 DIAGNOSIS — E1165 Type 2 diabetes mellitus with hyperglycemia: Secondary | ICD-10-CM | POA: Diagnosis not present

## 2024-05-19 DIAGNOSIS — R42 Dizziness and giddiness: Secondary | ICD-10-CM | POA: Diagnosis not present

## 2024-06-06 ENCOUNTER — Encounter (HOSPITAL_BASED_OUTPATIENT_CLINIC_OR_DEPARTMENT_OTHER): Payer: Self-pay | Admitting: Emergency Medicine

## 2024-06-06 ENCOUNTER — Emergency Department (HOSPITAL_BASED_OUTPATIENT_CLINIC_OR_DEPARTMENT_OTHER)
Admission: EM | Admit: 2024-06-06 | Discharge: 2024-06-07 | Disposition: A | Discharge status: Home / Self Care | Attending: Emergency Medicine | Admitting: Emergency Medicine

## 2024-06-06 ENCOUNTER — Other Ambulatory Visit: Payer: Self-pay

## 2024-06-06 ENCOUNTER — Emergency Department (HOSPITAL_BASED_OUTPATIENT_CLINIC_OR_DEPARTMENT_OTHER)

## 2024-06-06 DIAGNOSIS — R072 Precordial pain: Secondary | ICD-10-CM | POA: Diagnosis not present

## 2024-06-06 DIAGNOSIS — R079 Chest pain, unspecified: Secondary | ICD-10-CM | POA: Diagnosis not present

## 2024-06-06 DIAGNOSIS — E119 Type 2 diabetes mellitus without complications: Secondary | ICD-10-CM | POA: Diagnosis not present

## 2024-06-06 DIAGNOSIS — R140 Abdominal distension (gaseous): Secondary | ICD-10-CM | POA: Insufficient documentation

## 2024-06-06 LAB — CBC
HCT: 42.6 % (ref 39.0–52.0)
Hemoglobin: 14.9 g/dL (ref 13.0–17.0)
MCH: 32.3 pg (ref 26.0–34.0)
MCHC: 35 g/dL (ref 30.0–36.0)
MCV: 92.2 fL (ref 80.0–100.0)
Platelets: 141 10*3/uL — ABNORMAL LOW (ref 150–400)
RBC: 4.62 MIL/uL (ref 4.22–5.81)
RDW: 12.8 % (ref 11.5–15.5)
WBC: 4.2 10*3/uL (ref 4.0–10.5)
nRBC: 0 % (ref 0.0–0.2)

## 2024-06-06 MED ORDER — ALUM & MAG HYDROXIDE-SIMETH 200-200-20 MG/5ML PO SUSP
30.0000 mL | Freq: Once | ORAL | Status: AC
  Administered 2024-06-06: 30 mL via ORAL
  Filled 2024-06-06: qty 30

## 2024-06-06 NOTE — ED Triage Notes (Signed)
 Patient reports left side chets pain that's started tonight while sitting down. Describes the pain as tightness and denies radiation. Denies nausea, shortness or breath, or other associating symptoms.

## 2024-06-07 LAB — BASIC METABOLIC PANEL WITH GFR
Anion gap: 12 (ref 5–15)
BUN: 19 mg/dL (ref 8–23)
CO2: 25 mmol/L (ref 22–32)
Calcium: 9.1 mg/dL (ref 8.9–10.3)
Chloride: 103 mmol/L (ref 98–111)
Creatinine, Ser: 0.95 mg/dL (ref 0.61–1.24)
GFR, Estimated: >60 mL/min (ref >60)
Glucose, Bld: 228 mg/dL — ABNORMAL HIGH (ref 70–99)
Potassium: 3.9 mmol/L (ref 3.5–5.1)
Sodium: 140 mmol/L (ref 135–145)

## 2024-06-07 LAB — TROPONIN T, HIGH SENSITIVITY
Troponin T High Sensitivity: <15 ng/L (ref <19)
Troponin T High Sensitivity: <15 ng/L (ref <19)

## 2024-06-07 MED ORDER — OMEPRAZOLE 20 MG PO CPDR: 20.0000 mg | DELAYED_RELEASE_CAPSULE | Freq: Every day | ORAL | 0 refills | Status: DC

## 2024-06-07 MED ORDER — OMEPRAZOLE 20 MG PO CPDR
20.0000 mg | DELAYED_RELEASE_CAPSULE | Freq: Every day | ORAL | 0 refills | Status: AC
Start: 2024-06-07 — End: ?

## 2024-06-07 NOTE — ED Provider Notes (Signed)
 [facility] Provider Note   CSN: [id number] Arrival date & time: 06/06/24  2317     Patient presents with: Chest Pain   Jermaine [name] is a 78 y.o. male.   The history is provided by the patient.  Chest Pain Pain location:  L chest Pain quality: tightness   Pain radiates to:  Does not radiate Pain severity:  Moderate Onset quality:  Sudden Timing:  Constant Progression:  Unchanged Chronicity:  New Context: eating and at rest   Context: not breathing   Context comment:  Ate eating cabbage and other foods Relieved by:  Nothing Worsened by:  Nothing Ineffective treatments:  None tried Associated symptoms: no back pain, no cough, no diaphoresis, no fever, no lower extremity edema, no nausea, no near-syncope, no shortness of breath, no syncope, no vomiting and no weakness   Risk factors: male sex   Patient with DM presents with left sided chest tightness at rest post prandial tonight.      Past Medical History:  Diagnosis Date   DM (diabetes mellitus) (HCC)    Lumbosacral radiculopathy at L5    foot drop left   Sleep apnea    CPAP   Spinal stenosis      Prior to Admission medications   Medication Sig Start Date End Date Taking? Authorizing Provider  omeprazole (PRILOSEC) 20 MG capsule Take 1 capsule (20 mg total) by mouth daily. 06/07/24   Jaiden Wahab, MD  [facility] VERIO test strip 1 each daily. 05/02/23   [provider]  traMADol  (ULTRAM ) 50 MG tablet Take 1-2 tablets (50-100 mg total) by mouth every 6 (six) hours as needed for moderate pain or severe pain. Patient not taking: Reported on 06/28/2022 10/28/16   Candyce Champagne, MD    Allergies: Aspirin     Review of Systems  Constitutional:  Negative for diaphoresis and fever.  Respiratory:  Negative for cough and shortness of breath.   Cardiovascular:  Positive for chest pain. Negative for syncope and near-syncope.  Gastrointestinal:  Negative for nausea and  vomiting.  Musculoskeletal:  Negative for back pain and neck pain.  Neurological:  Negative for weakness.  All other systems reviewed and are negative.   Updated Vital Signs BP 132/79   Pulse (!) 59   Temp (!) 97.4 F (36.3 C) (Temporal)   Resp 19   Ht 6' (1.829 m)   Wt 89.8 kg   SpO2 97%   BMI 26.85 kg/m   Physical Exam Vitals and nursing note reviewed.  Constitutional:      General: He is not in acute distress.    Appearance: Normal appearance. He is well-developed. He is not diaphoretic.  HENT:     Head: Normocephalic and atraumatic.     Nose: Nose normal.   Eyes:     Conjunctiva/sclera: Conjunctivae normal.     Pupils: Pupils are equal, round, and reactive to light.    Cardiovascular:     Rate and Rhythm: Normal rate and regular rhythm.     Pulses: Normal pulses.     Heart sounds: Normal heart sounds.  Pulmonary:     Effort: Pulmonary effort is normal.     Breath sounds: Normal breath sounds. No wheezing or rales.  Abdominal:     General: Bowel sounds are increased.     Palpations: Abdomen is soft.     Tenderness: There is no abdominal tenderness. There is no guarding or rebound.     Comments: Vernetta Gong  in the upper abdomen    Musculoskeletal:        General: Normal range of motion.     Cervical back: Normal range of motion and neck supple.     Right lower leg: No edema.     Left lower leg: No edema.   Skin:    General: Skin is warm and dry.     Capillary Refill: Capillary refill takes less than 2 seconds.   Neurological:     General: No focal deficit present.     Mental Status: He is alert and oriented to person, place, and time.     Deep Tendon Reflexes: Reflexes normal.   Psychiatric:        Mood and Affect: Mood normal.     (all labs ordered are listed, but only abnormal results are displayed) Results for orders placed or performed during the hospital encounter of 06/06/24  CBC   Collection Time: 06/06/24 11:27 PM  Result Value Ref Range   WBC  4.2 4.0 - 10.5 K/uL   RBC 4.62 4.22 - 5.81 MIL/uL   Hemoglobin 14.9 13.0 - 17.0 g/dL   HCT 16.1 09.6 - 04.5 %   MCV 92.2 80.0 - 100.0 fL   MCH 32.3 26.0 - 34.0 pg   MCHC 35.0 30.0 - 36.0 g/dL   RDW 40.9 81.1 - 91.4 %   Platelets 141 (L) 150 - 400 K/uL   nRBC 0.0 0.0 - 0.2 %  Basic metabolic panel with GFR   Collection Time: 06/07/24  1:33 AM  Result Value Ref Range   Sodium 140 135 - 145 mmol/L   Potassium 3.9 3.5 - 5.1 mmol/L   Chloride 103 98 - 111 mmol/L   CO2 25 22 - 32 mmol/L   Glucose, Bld 228 (H) 70 - 99 mg/dL   BUN 19 8 - 23 mg/dL   Creatinine, Ser 7.82 0.61 - 1.24 mg/dL   Calcium 9.1 8.9 - 95.6 mg/dL   GFR, Estimated >21 >30 mL/min   Anion gap 12 5 - 15  Troponin T, High Sensitivity   Collection Time: 06/07/24  1:33 AM  Result Value Ref Range   Troponin T High Sensitivity <15 <19 ng/L  Troponin T, High Sensitivity   Collection Time: 06/07/24  3:20 AM  Result Value Ref Range   Troponin T High Sensitivity <15 <19 ng/L   DG Chest 2 View Result Date: 06/06/2024 EXAM: 2 VIEW(S) XRAY OF THE CHEST 06/06/2024 11:45:00 PM COMPARISON: 04/19/2024 CLINICAL HISTORY: Chest pain. FINDINGS: LUNGS AND PLEURA: No focal pulmonary opacity. No pulmonary edema. No pleural effusion. No pneumothorax. HEART AND MEDIASTINUM: No acute abnormality of the cardiac and mediastinal silhouettes. BONES AND SOFT TISSUES: No acute osseous abnormality. IMPRESSION: 1. No acute process. Electronically signed by: Zadie Herter MD 06/06/2024 11:48 PM EDT RP Workstation: [id number]    EKG:  EKG Interpretation Date/Time:  Sunday June 06 2024 23:20:45 EDT Ventricular Rate:  72 PR Interval:  150 QRS Duration:  119 QT Interval:  389 QTC Calculation: 426 R Axis:   -70  Text Interpretation: Sinus rhythm Left anterior fascicular block Confirmed by Maralee Senate, Kamalei Roeder ([id number]) on 06/07/2024 4:44:55 AM        Radiology: Lenell Query Chest 2 View Result Date: 06/06/2024 EXAM: 2 VIEW(S) XRAY OF THE CHEST 06/06/2024  11:45:00 PM COMPARISON: 04/19/2024 CLINICAL HISTORY: Chest pain. FINDINGS: LUNGS AND PLEURA: No focal pulmonary opacity. No pulmonary edema. No pleural effusion. No pneumothorax. HEART AND MEDIASTINUM: No acute abnormality of the cardiac and  mediastinal silhouettes. BONES AND SOFT TISSUES: No acute osseous abnormality. IMPRESSION: 1. No acute process. Electronically signed by: Zadie Herter MD 06/06/2024 11:48 PM EDT RP Workstation: [id number]     Procedures   Medications Ordered in the ED  alum & mag hydroxide-simeth (MAALOX/MYLANTA) 200-200-20 MG/5ML suspension 30 mL (30 mLs Oral Given 06/06/24 2352)                                    Medical Decision Making Patient with left chest tightness   Amount and/or Complexity of Data Reviewed Independent Historian: spouse    Details: See above  External Data Reviewed: notes.    Details: Previous notes  reviewed Labs: ordered.    Details: 2 negative troponins < 15.  Normal white count 4.2, normal hemoglobin 14.9, platelets slight low 141.  Normal sodium 140, normal potassium 3.9 Radiology: ordered and independent interpretation performed.    Details: No PNA by me  ECG/medicine tests: ordered and independent interpretation performed. Decision-making details documented in ED Course.  Risk OTC drugs. Prescription drug management. Risk Details: Well appearing with normal vitals, exam labs and imaging.  Ruled out for ACS in the ED heart score is 2 low risk for MACe.  I do not believe this is a PE.  Pain is not pleuritic, no leg pain.  No travel.  No hemoptysis no SOB. Given history and exam I believe this is most likely gerd from dinner he ate prior to symptoms.  Will start bland diet and PPI.  Stable for discharge.  Strict returns     Final diagnoses:  Precordial pain   No signs of systemic illness or infection. The patient is nontoxic-appearing on exam and vital signs are within normal limits.  I have reviewed the triage vital signs  and the nursing notes. Pertinent labs & imaging results that were available during my care of the patient were reviewed by me and considered in my medical decision making (see chart for details). After history, exam, and medical workup I feel the patient has been appropriately medically screened and is safe for discharge home. Pertinent diagnoses were discussed with the patient. Patient was given return precautions.    ED Discharge Orders          Ordered    omeprazole (PRILOSEC) 20 MG capsule  Daily,   Status:  Discontinued        06/07/24 0325    omeprazole (PRILOSEC) 20 MG capsule  Daily        06/07/24 0349               Adan Baehr, MD 06/07/24 0503

## 2024-08-18 DIAGNOSIS — E1136 Type 2 diabetes mellitus with diabetic cataract: Secondary | ICD-10-CM | POA: Diagnosis not present

## 2024-08-18 DIAGNOSIS — M21372 Foot drop, left foot: Secondary | ICD-10-CM | POA: Diagnosis not present

## 2024-08-18 DIAGNOSIS — E7800 Pure hypercholesterolemia, unspecified: Secondary | ICD-10-CM | POA: Diagnosis not present

## 2024-08-18 DIAGNOSIS — G4733 Obstructive sleep apnea (adult) (pediatric): Secondary | ICD-10-CM | POA: Diagnosis not present

## 2024-09-10 DIAGNOSIS — G4733 Obstructive sleep apnea (adult) (pediatric): Secondary | ICD-10-CM | POA: Diagnosis not present

## 2024-10-13 DIAGNOSIS — G4733 Obstructive sleep apnea (adult) (pediatric): Secondary | ICD-10-CM | POA: Diagnosis not present

## 2024-11-03 DIAGNOSIS — E7800 Pure hypercholesterolemia, unspecified: Secondary | ICD-10-CM | POA: Diagnosis not present

## 2024-11-03 DIAGNOSIS — R17 Unspecified jaundice: Secondary | ICD-10-CM | POA: Diagnosis not present

## 2024-11-03 DIAGNOSIS — E1136 Type 2 diabetes mellitus with diabetic cataract: Secondary | ICD-10-CM | POA: Diagnosis not present

## 2024-11-17 DIAGNOSIS — R17 Unspecified jaundice: Secondary | ICD-10-CM | POA: Diagnosis not present
